# Patient Record
Sex: Male | Born: 1985 | Race: White | Hispanic: No | Marital: Married | Smoking: Current every day smoker
Health system: Southern US, Community
[De-identification: ages and names within clinical notes are randomized; demographics above are authoritative.]

## PROBLEM LIST (undated history)

## (undated) DIAGNOSIS — F1120 Opioid dependence, uncomplicated: Secondary | ICD-10-CM

## (undated) DIAGNOSIS — M549 Dorsalgia, unspecified: Secondary | ICD-10-CM

## (undated) DIAGNOSIS — I10 Essential (primary) hypertension: Secondary | ICD-10-CM

---
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
## 2003-01-15 ENCOUNTER — Encounter: Payer: Self-pay | Admitting: Family Medicine

## 2003-01-15 ENCOUNTER — Ambulatory Visit (HOSPITAL_COMMUNITY): Admission: RE | Admit: 2003-01-15 | Discharge: 2003-01-15 | Payer: Self-pay | Admitting: Family Medicine

## 2003-01-29 ENCOUNTER — Emergency Department (HOSPITAL_COMMUNITY): Admission: EM | Admit: 2003-01-29 | Discharge: 2003-01-29 | Payer: Self-pay | Admitting: *Deleted

## 2003-01-29 ENCOUNTER — Encounter: Payer: Self-pay | Admitting: *Deleted

## 2003-05-02 ENCOUNTER — Encounter: Payer: Self-pay | Admitting: Emergency Medicine

## 2003-05-02 ENCOUNTER — Emergency Department (HOSPITAL_COMMUNITY): Admission: EM | Admit: 2003-05-02 | Discharge: 2003-05-02 | Payer: Self-pay | Admitting: Emergency Medicine

## 2003-07-09 ENCOUNTER — Encounter: Payer: Self-pay | Admitting: *Deleted

## 2003-07-09 ENCOUNTER — Emergency Department (HOSPITAL_COMMUNITY): Admission: EM | Admit: 2003-07-09 | Discharge: 2003-07-09 | Payer: Self-pay | Admitting: *Deleted

## 2003-09-01 ENCOUNTER — Emergency Department (HOSPITAL_COMMUNITY): Admission: EM | Admit: 2003-09-01 | Discharge: 2003-09-01 | Payer: Self-pay | Admitting: Emergency Medicine

## 2004-01-12 ENCOUNTER — Emergency Department (HOSPITAL_COMMUNITY): Admission: EM | Admit: 2004-01-12 | Discharge: 2004-01-12 | Payer: Self-pay | Admitting: Emergency Medicine

## 2005-12-04 ENCOUNTER — Emergency Department (HOSPITAL_COMMUNITY): Admission: EM | Admit: 2005-12-04 | Discharge: 2005-12-04 | Payer: Self-pay | Admitting: Emergency Medicine

## 2005-12-14 ENCOUNTER — Emergency Department (HOSPITAL_COMMUNITY): Admission: EM | Admit: 2005-12-14 | Discharge: 2005-12-14 | Payer: Self-pay | Admitting: Emergency Medicine

## 2006-02-02 ENCOUNTER — Emergency Department (HOSPITAL_COMMUNITY): Admission: EM | Admit: 2006-02-02 | Discharge: 2006-02-02 | Payer: Self-pay | Admitting: Emergency Medicine

## 2006-02-15 ENCOUNTER — Emergency Department (HOSPITAL_COMMUNITY): Admission: EM | Admit: 2006-02-15 | Discharge: 2006-02-15 | Payer: Self-pay | Admitting: Emergency Medicine

## 2006-07-14 ENCOUNTER — Emergency Department (HOSPITAL_COMMUNITY): Admission: EM | Admit: 2006-07-14 | Discharge: 2006-07-14 | Payer: Self-pay | Admitting: Emergency Medicine

## 2008-02-17 ENCOUNTER — Emergency Department (HOSPITAL_COMMUNITY): Admission: EM | Admit: 2008-02-17 | Discharge: 2008-02-17 | Payer: Self-pay | Admitting: Emergency Medicine

## 2010-05-26 ENCOUNTER — Emergency Department (HOSPITAL_COMMUNITY): Admission: EM | Admit: 2010-05-26 | Discharge: 2010-05-26 | Payer: Self-pay | Admitting: Emergency Medicine

## 2010-09-18 ENCOUNTER — Emergency Department (HOSPITAL_COMMUNITY): Admission: EM | Admit: 2010-09-18 | Discharge: 2010-09-18 | Payer: Self-pay | Admitting: Emergency Medicine

## 2011-04-14 ENCOUNTER — Emergency Department (HOSPITAL_COMMUNITY)
Admission: EM | Admit: 2011-04-14 | Discharge: 2011-04-14 | Disposition: A | Discharge status: Home / Self Care | Payer: Medicaid Other | Attending: Emergency Medicine | Admitting: Emergency Medicine

## 2011-04-14 ENCOUNTER — Emergency Department (HOSPITAL_COMMUNITY): Payer: Medicaid Other

## 2011-04-14 DIAGNOSIS — M79609 Pain in unspecified limb: Secondary | ICD-10-CM | POA: Insufficient documentation

## 2011-04-17 ENCOUNTER — Emergency Department (HOSPITAL_COMMUNITY)
Admission: EM | Admit: 2011-04-17 | Discharge: 2011-04-17 | Disposition: A | Discharge status: Home / Self Care | Payer: Medicaid Other | Attending: Emergency Medicine | Admitting: Emergency Medicine

## 2011-04-17 ENCOUNTER — Emergency Department (HOSPITAL_COMMUNITY): Payer: Medicaid Other

## 2011-04-17 DIAGNOSIS — S9030XA Contusion of unspecified foot, initial encounter: Secondary | ICD-10-CM | POA: Insufficient documentation

## 2011-04-17 DIAGNOSIS — Y92009 Unspecified place in unspecified non-institutional (private) residence as the place of occurrence of the external cause: Secondary | ICD-10-CM | POA: Insufficient documentation

## 2011-04-17 DIAGNOSIS — Y30XXXA Falling, jumping or pushed from a high place, undetermined intent, initial encounter: Secondary | ICD-10-CM | POA: Insufficient documentation

## 2011-04-17 DIAGNOSIS — F17200 Nicotine dependence, unspecified, uncomplicated: Secondary | ICD-10-CM | POA: Insufficient documentation

## 2011-05-05 ENCOUNTER — Ambulatory Visit (HOSPITAL_COMMUNITY)
Admission: RE | Admit: 2011-05-05 | Discharge: 2011-05-05 | Disposition: A | Discharge status: Home / Self Care | Payer: Medicaid Other | Source: Ambulatory Visit | Attending: Orthopaedic Surgery | Admitting: Orthopaedic Surgery

## 2011-05-05 DIAGNOSIS — IMO0001 Reserved for inherently not codable concepts without codable children: Secondary | ICD-10-CM | POA: Insufficient documentation

## 2011-05-05 DIAGNOSIS — M6281 Muscle weakness (generalized): Secondary | ICD-10-CM | POA: Insufficient documentation

## 2011-05-05 DIAGNOSIS — M25579 Pain in unspecified ankle and joints of unspecified foot: Secondary | ICD-10-CM | POA: Insufficient documentation

## 2011-05-05 DIAGNOSIS — R262 Difficulty in walking, not elsewhere classified: Secondary | ICD-10-CM | POA: Insufficient documentation

## 2011-05-17 ENCOUNTER — Telehealth (HOSPITAL_COMMUNITY): Payer: Self-pay

## 2011-05-17 ENCOUNTER — Inpatient Hospital Stay (HOSPITAL_COMMUNITY): Admission: RE | Admit: 2011-05-17 | Payer: Medicaid Other | Source: Ambulatory Visit

## 2011-05-19 ENCOUNTER — Ambulatory Visit (HOSPITAL_COMMUNITY)
Admission: RE | Admit: 2011-05-19 | Discharge: 2011-05-19 | Disposition: A | Discharge status: Home / Self Care | Payer: Medicaid Other | Source: Ambulatory Visit

## 2011-05-19 DIAGNOSIS — M25579 Pain in unspecified ankle and joints of unspecified foot: Secondary | ICD-10-CM | POA: Insufficient documentation

## 2011-05-19 NOTE — Progress Notes (Signed)
Physical Therapy Treatment Patient Name: Daniel Gonzales [id number] Date: 05/19/2011  Time In: 5:35 Time Out: 6:24 Visit #: 2/3 Initial eval: 05/05/2011 Next Re-eval: 06/05/2011 Charge: therex 39 min Ice x 10 min  Subjective: Symptoms/Limitations Symptoms: 2-3/10 R ankle Limitations: Walking How long can you sit comfortably?: unlimitied How long can you stand comfortably?: 1 hour How long can you walk comfortably?: 15-20 minutes Pain Assessment Currently in Pain?: Yes Pain Score:   3 Pain Location: Ankle Pain Orientation: Right Pain Type: Acute pain Pain Onset: 1 to 4 weeks ago Pain Frequency: Intermittent Pain Relieving Factors: ice with elevation   Exercise/Treatments STANDING: Heel raise Toe raise Toe walking Heel walking Baps L2 5 reps SLS on foam max of 3 Gastroc st. 3x 30" Soleus st 3x 30" Plantar st 3x 30" SITTING Towel inversion/eversion Towel crunch Blue tband all directions 10x 5" hold Ice x 10 min Stability Exercises Heel Raises: 10 reps Additional Hip Exercises SLS: 1'+, 39" on foam Knee Exercises Heel Raises: 10 reps Additional Knee Exercises SLS: 1'+, 39" on foam Ankle Exercises Ankle Dorsiflexion: 10 reps;Theraband Theraband Level (Ankle Dorsiflexion): Level 4 (Blue) Ankle Plantar Flexion: 10 reps;Theraband Theraband Level (Ankle Plantar Flexion): Level 4 (Blue) Ankle Eversion: 10 reps;Theraband Theraband Level (Ankle Eversion): Level 4 (Blue) Ankle Inversion: 10 reps;Theraband Theraband Level (Ankle Inversion): Level 4 (Blue) Heel Raises: 10 reps Toe Raise: 10 reps Additional Ankle Exercises ABC's: 1 rep Towel Crunch: 1 rep Towel Inversion/Eversion: 3 reps BAPS: Level 2;5 reps SLS: 1'+, 39" on foam Heel Walk (Round Trip): 1 Toe Walk (Round Trip): 1 Balance Exercises Heel Raises: 10 reps Toe Raise: 10 reps Modalities Modalities: Cryotherapy Cryotherapy Number Minutes Cryotherapy: 10 Minutes Cryotherapy Location: Ankle Type of  Cryotherapy: Ice pack  Goals PT Short Term Goals Short Term Goal 1: Pt. will be independent in HEP in order to maximize therapeutic effect. Short Term Goal 2: Pt. will report pain less than or equal to 2/10 for 50% of his day. Short Term Goal 3: Pt will improve LE strength to WNL. Short Term Goal 4: Pt will present with decreased allodynia to the R ankle and foot. PT Long Term Goals Long Term Goal 1: Pt will report pain less than or equal to 2/10 while independently walking for 1 hour in order to participate in community activities. 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 Long Term Goal 3: Pt will improve LEFS by 9 points for improved perceived functional ability. End of Session Patient Active Problem List  Diagnoses  . Pain in joint, ankle and foot   PT - End of Session Activity Tolerance: Patient tolerated treatment well General Behavior During Session: [facility] for tasks performed Cognition: [facility] for tasks performed PT Assessment and Plan Clinical Impression Statement: Pt tolerated tx well, followed demonstration with min vc for ankle movements rathan than hip motions.  Therapist stabilized knee with sitting ex for increased ankle ROM.  Pt stated pain with eversion motion, pain decreased at end of session with ice. Rehab Potential: Good PT Frequency: Min 2X/week PT Duration: 4 weeks PT Treatment/Interventions: Therapeutic exercise;Gait training;Balance training (modalities for pain reduction) PT Plan: continue with current POC 2 more sessions per medicaid approval.  Juel Burrow 05/19/2011, 6:30 PM

## 2011-05-24 ENCOUNTER — Ambulatory Visit (HOSPITAL_COMMUNITY): Payer: Medicaid Other

## 2011-05-26 ENCOUNTER — Ambulatory Visit (HOSPITAL_COMMUNITY): Payer: Medicaid Other | Admitting: *Deleted

## 2011-05-31 ENCOUNTER — Ambulatory Visit (HOSPITAL_COMMUNITY): Payer: Medicaid Other | Admitting: Physical Therapy

## 2011-06-02 ENCOUNTER — Inpatient Hospital Stay (HOSPITAL_COMMUNITY): Admission: RE | Admit: 2011-06-02 | Payer: Medicaid Other | Source: Ambulatory Visit

## 2011-06-02 ENCOUNTER — Telehealth (HOSPITAL_COMMUNITY): Payer: Self-pay

## 2011-06-21 ENCOUNTER — Ambulatory Visit (HOSPITAL_COMMUNITY): Payer: Medicaid Other | Admitting: Physical Therapy

## 2011-06-22 ENCOUNTER — Telehealth (HOSPITAL_COMMUNITY): Payer: Self-pay

## 2011-06-28 ENCOUNTER — Inpatient Hospital Stay (HOSPITAL_COMMUNITY): Admission: RE | Admit: 2011-06-28 | Payer: Medicaid Other | Source: Ambulatory Visit | Admitting: Physical Therapy

## 2011-07-14 ENCOUNTER — Ambulatory Visit (HOSPITAL_COMMUNITY)
Admission: RE | Admit: 2011-07-14 | Discharge: 2011-07-14 | Disposition: A | Discharge status: Home / Self Care | Payer: Medicaid Other | Source: Ambulatory Visit | Attending: Orthopaedic Surgery | Admitting: Orthopaedic Surgery

## 2011-07-14 DIAGNOSIS — IMO0001 Reserved for inherently not codable concepts without codable children: Secondary | ICD-10-CM | POA: Insufficient documentation

## 2011-07-14 DIAGNOSIS — M542 Cervicalgia: Secondary | ICD-10-CM | POA: Insufficient documentation

## 2011-07-14 DIAGNOSIS — M25619 Stiffness of unspecified shoulder, not elsewhere classified: Secondary | ICD-10-CM | POA: Insufficient documentation

## 2011-07-14 NOTE — Progress Notes (Signed)
Physical Therapy Evaluation  Patient Details  Name: Daniel Gonzales MRN: [id number] Date of Birth: [birth year]  Today's Date: 07/14/2011 Time: 1630-1710 Time Calculation (min): 40 min Visit#: 1 of 4 Re-eval: 07/28/11    Past Medical History: No past medical history on file. Past Surgical History: No past surgical history on file.  Subjective Symptoms/Limitations Symptoms: Pt states that when he fell off the porch his neck got hurt as well.  He states that at this point his ankle is doing well but his neck is in constant pain. Pain Assessment Pain Score:   3 (max 8/10) Pain Location: Arm Pain Orientation: Right;Left Pain Type: Chronic pain Pain Onset: More than a month ago Pain Frequency: Constant Pain Relieving Factors: lieing down  Precautions/Restrictions     Prior Functioning  Home Living Type of Home: House Lives With: Family Prior Function Level of Independence: Independent with basic ADLs Driving: Yes Able to Take Stairs Reciprically: Yes  Cognition Cognition Overall Cognitive Status: Appears within functional limits for tasks assessed Arousal/Alertness: Awake/alert  Sensation/Coordination/Flexibility    Assessment Cervical AROM Overall Cervical AROM: Within functional limits for tasks performed Cervical Strength Overall Cervical Strength: Deficits  Mobility (including Balance)    Posture/Postural Control Posture/Postural Control:  (Pt has slight forward head and sits in a forward slumped pos)  Exercise/Treatments Stretches   Neck Exercises Neck Retraction: 10 reps Shoulder Extension: Strengthening;Both;Theraband Theraband Level (Shoulder Extension): Level 4 (Blue) Row: Strengthening;Both;Theraband Theraband Level (Row): Level 4 (Blue) Scapular Retraction: Strengthening;Both;Standing;Theraband Theraband Level (Scapular Retraction): Level 4 (Blue) Additional Neck Exercises       Physical Therapy Assessment and Plan PT Assessment and  Plan Clinical Impression Statement: Pt with neck weakness and pain who will benefit from skilled PT to educate in posture and strengthening ex. Rehab Potential: Good PT Frequency: Min 2X/week PT Duration:  (wks) PT Treatment/Interventions: Therapeutic exercise PT Plan: see for three treatments and D/C to HEP    Goals PT Short Term Goals Time to Complete Goals: 2 weeks PT Short Term Goal 1: I HEP PT Short Term Goal 2: Strength of c spine 4+/5 PT Short Term Goal 3: Pain no greater than a 4  Problem List Patient Active Problem List  Diagnoses  . Pain in joint, ankle and foot  . Cervicalgia    PT - End of Session Activity Tolerance: Patient tolerated treatment well General Behavior During Session: [facility] for tasks performed Cognition: [facility] for tasks performed   Riely,CINDY 07/14/2011, 5:26 PM  Physician Documentation Your signature is required to indicate approval of the treatment plan as stated above.  Please sign and either send electronically or make a copy of this report for your files and return this physician signed original.   Please mark one 1.__approve of plan  2. ___approve of plan with the following conditions.   ______________________________                                                          _____________________ Physician Signature  Date  

## 2011-07-14 NOTE — Patient Instructions (Signed)
HEP T-band,(given), c-retraction and shoulder shrugs.

## 2011-07-26 ENCOUNTER — Ambulatory Visit (HOSPITAL_COMMUNITY)
Admission: RE | Admit: 2011-07-26 | Discharge: 2011-07-26 | Disposition: A | Discharge status: Home / Self Care | Payer: Medicaid Other | Source: Ambulatory Visit | Attending: Orthopaedic Surgery | Admitting: Orthopaedic Surgery

## 2011-07-26 DIAGNOSIS — IMO0001 Reserved for inherently not codable concepts without codable children: Secondary | ICD-10-CM | POA: Insufficient documentation

## 2011-07-26 DIAGNOSIS — R262 Difficulty in walking, not elsewhere classified: Secondary | ICD-10-CM | POA: Insufficient documentation

## 2011-07-26 DIAGNOSIS — M542 Cervicalgia: Secondary | ICD-10-CM

## 2011-07-26 DIAGNOSIS — M6281 Muscle weakness (generalized): Secondary | ICD-10-CM | POA: Insufficient documentation

## 2011-07-26 DIAGNOSIS — M25579 Pain in unspecified ankle and joints of unspecified foot: Secondary | ICD-10-CM | POA: Insufficient documentation

## 2011-07-26 NOTE — Progress Notes (Signed)
Physical Therapy Treatment Patient Details  Name: Daniel Gonzales MRN: [id number] Date of Birth: Feb 04, 1986  Today's Date: 07/26/2011 Time: 4098-1191 Time Calculation (min): 45 min Visit#: 2  of 4   Re-eval: 07/28/11  Charge: therex 29 min Manual STM 12 min  Subjective: Symptoms/Limitations Symptoms: Pt has been painting with work, increased neck pain L>R 5/10. Pain Assessment Currently in Pain?: Yes Pain Score:   5 Pain Location: Neck Pain Orientation: Left  Objective: forward rolled shoulder posture, vc to correct  Exercise/Treatments Stretches Upper Trapezius Stretch: 3 reps;30 seconds Levator Stretch: 3 reps;30 seconds Shoulder Rolls: 10 Neck Exercises Neck Retraction: 10 reps;Seated Neck Lateral Flexion - Right: 5 reps;Seated;Limitations Neck Lateral Flexion - Right Limitations: isometrics x 5" Neck Lateral Flexion - Left: 5 reps;Seated;Limitations Neck Lateral Flexion - Left Limitations: isometric x 5" Neck Rotation - Right: 5 reps;Seated;Limitations Neck Rotation - Right Limitations: isometrics x 5" Neck Rotation - Left: 5 reps;Seated;Limitations Neck Rotation - Left Limitations: isometric x 5" X to V: Seated;10 reps Additional Neck Exercises UBE (Upper Arm Bike): 4' @ 1.0 backwards  Manual Therapy Manual Therapy: Other (comment) Other Manual Therapy: STM upper, mid and lower traps, L scalenes and levator.  Physical Therapy Assessment and Plan PT Assessment and Plan Clinical Impression Statement: Began postural strengthening therex and stretches per doc flow sheet.  Pt performed all activities correctly with difficulty but stated increased neck pain.  Able to partially resolve spasms L upper trap, scalene and levator with STM. Palpalated edema L upper and mid traps.  Pt encouraged to ice neck at home (had child screaming in waiting room, had to leave). PT Plan: Continue POC for 2 more sessions then D/C to HEP.    Goals    Problem List Patient Active  Problem List  Diagnoses  . Pain in joint, ankle and foot  . Cervicalgia    PT - End of Session Activity Tolerance: Patient tolerated treatment well General Behavior During Session: [facility] for tasks performed Cognition: [facility] for tasks performed  Juel Burrow 07/26/2011, 6:21 PM

## 2011-07-28 ENCOUNTER — Ambulatory Visit (HOSPITAL_COMMUNITY): Payer: Medicaid Other

## 2011-07-28 ENCOUNTER — Telehealth (HOSPITAL_COMMUNITY): Payer: Self-pay

## 2011-08-08 ENCOUNTER — Inpatient Hospital Stay (HOSPITAL_COMMUNITY): Admission: RE | Admit: 2011-08-08 | Payer: Medicaid Other | Source: Ambulatory Visit

## 2011-08-08 ENCOUNTER — Telehealth (HOSPITAL_COMMUNITY): Payer: Self-pay

## 2011-09-27 ENCOUNTER — Ambulatory Visit (HOSPITAL_COMMUNITY): Payer: Medicaid Other | Attending: Orthopaedic Surgery | Admitting: Physical Therapy

## 2011-09-27 DIAGNOSIS — M542 Cervicalgia: Secondary | ICD-10-CM | POA: Insufficient documentation

## 2011-09-27 DIAGNOSIS — IMO0001 Reserved for inherently not codable concepts without codable children: Secondary | ICD-10-CM | POA: Insufficient documentation

## 2011-09-27 DIAGNOSIS — M25619 Stiffness of unspecified shoulder, not elsewhere classified: Secondary | ICD-10-CM | POA: Insufficient documentation

## 2011-11-09 ENCOUNTER — Ambulatory Visit (HOSPITAL_COMMUNITY): Payer: Medicaid Other

## 2011-11-13 ENCOUNTER — Ambulatory Visit (HOSPITAL_COMMUNITY)
Admission: RE | Admit: 2011-11-13 | Discharge: 2011-11-13 | Disposition: A | Discharge status: Home / Self Care | Payer: Medicaid Other | Source: Ambulatory Visit | Attending: Orthopaedic Surgery | Admitting: Orthopaedic Surgery

## 2011-11-13 DIAGNOSIS — M542 Cervicalgia: Secondary | ICD-10-CM | POA: Insufficient documentation

## 2011-11-13 DIAGNOSIS — M6281 Muscle weakness (generalized): Secondary | ICD-10-CM | POA: Insufficient documentation

## 2011-11-13 DIAGNOSIS — M545 Low back pain, unspecified: Secondary | ICD-10-CM | POA: Insufficient documentation

## 2011-11-13 DIAGNOSIS — IMO0001 Reserved for inherently not codable concepts without codable children: Secondary | ICD-10-CM | POA: Insufficient documentation

## 2011-11-13 DIAGNOSIS — M25579 Pain in unspecified ankle and joints of unspecified foot: Secondary | ICD-10-CM | POA: Insufficient documentation

## 2011-11-13 NOTE — Progress Notes (Signed)
Physical Therapy Evaluation  Patient Details  Name: Daniel Gonzales MRN: [id number] Date of Birth: 21-Oct-1986  Today's Date: 11/13/2011 Time: 4098-1191 Time Calculation (min): 35 min Medicaid Authorization Time Period:  through Feb 1st   Medicaid Visit#: 0  of 3    Visit#: 1  of 4   Re-eval:   Assessment Diagnosis: cervical/ back pain.  Past Medical History: No past medical history on file. Past Surgical History: No past surgical history on file.  Subjective Symptoms/Limitations Symptoms: Daniel Gonzales states that he fell off a porch injuring both his low back and his neck area.  This incident occured June 15th of 2012.  The patient has been in therapy for both his back and his neck before  he is now doing his exercises twice a week.  The patient states that his neck is bgothering him more than his back.  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  He denies radiating pain down his legs and states the pain in his neck goes into his mid back but not into his arms.  He is being referred to therapy . How long can you sit comfortably?: sitting increases his pain in between his shoulder blades.  How long can you stand comfortably?: The patient states that he is able to stand for five to ten minutes before he has increased pain. How long can you walk comfortably?: walking is no problem Pain Assessment Currently in Pain?: Yes Pain Score:   4 (worst 8; best 3) Pain Location: Neck Pain Orientation: Right;Left Pain Type: Chronic pain Pain Radiating Towards: shoulder blade area. Pain Onset: More than a month ago Pain Frequency: Constant (varies in intensity.l) Multiple Pain Sites: Yes   Home Living Lives With: Family Type of Home: House Prior Function Level of Independence: Independent with basic ADLs Able to Take Stairs?: Yes Driving: Yes  Cognition/Observation Cognition Overall Cognitive Status: Appears within functional limits for tasks assessed Arousal/Alertness:  Awake/alert Observation/Other Assessments Observations: Pt continues to have slumped sitting posture despite verbally counseling pt that this will increase neck pain.  Sensation/Coordination/Flexibility/Functional Tests    Assessment Cervical AROM Overall Cervical AROM: Deficits (B rotation and R SB decreased 20%; ) Cervical Strength Overall Cervical Strength: Deficits (side bend decreased 20% B) Palpation Palpation: moderate mm spasm B trap  Exercise/Treatments Mobility/Balance  Posture/Postural Control Posture/Postural Control:  (Pt has slight forward head and sits in a forward slumped pos)     Physical Therapy Assessment and Plan PT Assessment and Plan Clinical Impression Statement: Pt already has exercises will concentrate next three sessions on modalities to decrease pain. Rehab Potential: Good PT Frequency: Min 2X/week PT Duration:  (2 wk) PT Plan: see three visits for STM and [place] with e-stim    Goals Home Exercise Program Pt will Perform Home Exercise Program: Independently PT Goal: Perform Home Exercise Program - Progress: Met PT Short Term Goals Time to Complete Short Term Goals: 2 weeks PT Short Term Goal 1: decrease sx of pain by 3 levels PT Short Term Goal 2: min or no mm spasm in trap area  Problem List Patient Active Problem List  Diagnoses  . Pain in joint, ankle and foot  . Cervicalgia    PT - End of Session Activity Tolerance: Patient tolerated treatment well General Behavior During Session: [facility] for tasks performed Cognition: [facility] for tasks performed   Daniel Gonzales 11/13/2011, 5:07 PM  Physician Documentation Your signature is required to indicate approval of the treatment plan as stated above.  Please sign and either send electronically or make a copy of this report for your files and return this physician signed original.   Please mark one 1.__approve of plan  2. ___approve of plan with the following  conditions.   ______________________________                                                          _____________________ Physician Signature                                                                                                             Date

## 2011-11-21 ENCOUNTER — Ambulatory Visit (HOSPITAL_COMMUNITY): Payer: Medicaid Other | Admitting: Physical Therapy

## 2011-11-23 ENCOUNTER — Ambulatory Visit (HOSPITAL_COMMUNITY)
Admission: RE | Admit: 2011-11-23 | Discharge: 2011-11-23 | Disposition: A | Discharge status: Home / Self Care | Payer: Medicaid Other | Source: Ambulatory Visit | Attending: Orthopaedic Surgery | Admitting: Orthopaedic Surgery

## 2011-11-23 NOTE — Progress Notes (Signed)
Physical Therapy Treatment Patient Details  Name: Daniel Gonzales MRN: [id number] Date of Birth: 1986/07/03  Today's Date: 11/23/2011 Time: 8295-6213 Time Calculation (min): 45 min Visit#: 2  of 4   Charges: Therex x 10' [place] x 8' Manual x 15'  Subjective: Symptoms/Limitations Symptoms: Pt reports that his neck was really bothering him at work today but the pain has gone down some. Pt states at work his pain was a 6/10. Pain Assessment Pain Score:   4 Pain Location: Neck Pain Orientation: Right;Left Pain Type: Chronic pain   Exercise/Treatments Stretches Levator Stretch: 2 reps;30 seconds Neck Exercises Neck Retraction: 10 reps  Modalities Modalities: Ultrasound;Electrical Stimulation Manual Therapy Other Manual Therapy: STM upper, mid and lower traps and rhomboids x 15' (Bilateral) Pharmacologist Location: B rhombiods/upper-mid trap Statistician Action: combo with [place] Electrical Stimulation Parameters: L 8; 4' each side Ultrasound Ultrasound Location: B rhombiods/upper-mid trap Ultrasound Parameters: Combo with estim 1.0 w/cm2 @ 4' each side Ultrasound Goals: Pain  Physical Therapy Assessment and Plan PT Assessment and Plan Clinical Impression Statement: Began estim/Us combo to decrease spams followed by massage. Multiple spasms noted in B traps and rhomboids. Pt reports pain decrease to 2/10 at end of session. PT Plan: Continue to progress per PT POC.    Goals    Problem List Patient Active Problem List  Diagnoses  . Pain in joint, ankle and foot  . Cervicalgia    PT - End of Session Activity Tolerance: Patient tolerated treatment well General Behavior During Session: [facility] for tasks performed Cognition: [facility] for tasks performed  Antonieta Iba 11/23/2011, 5:52 PM

## 2011-11-28 ENCOUNTER — Ambulatory Visit (HOSPITAL_COMMUNITY): Payer: Medicaid Other | Admitting: *Deleted

## 2011-11-30 ENCOUNTER — Ambulatory Visit (HOSPITAL_COMMUNITY): Payer: Medicaid Other | Admitting: Physical Therapy

## 2011-12-04 ENCOUNTER — Ambulatory Visit (HOSPITAL_COMMUNITY)
Admission: RE | Admit: 2011-12-04 | Discharge: 2011-12-04 | Disposition: A | Discharge status: Home / Self Care | Payer: Medicaid Other | Source: Ambulatory Visit | Attending: Orthopaedic Surgery | Admitting: Orthopaedic Surgery

## 2011-12-04 DIAGNOSIS — IMO0001 Reserved for inherently not codable concepts without codable children: Secondary | ICD-10-CM | POA: Insufficient documentation

## 2011-12-04 DIAGNOSIS — M542 Cervicalgia: Secondary | ICD-10-CM | POA: Insufficient documentation

## 2011-12-04 DIAGNOSIS — M25619 Stiffness of unspecified shoulder, not elsewhere classified: Secondary | ICD-10-CM | POA: Insufficient documentation

## 2011-12-04 NOTE — Progress Notes (Signed)
Physical Therapy Re-Evaluation-Medicaid  Patient Details  Name: Daniel Gonzales MRN: [id number] Date of Birth: [birth year]  Today's Date: 12/04/2011 Time: 6578-4696 Time Calculation (min): 70 min Charges: 1 re-eval, 32 TE, 8 Combo, 15 STM Visit#: 3  of 4   Re-eval: 12/04/11   Subjective Symptoms/Limitations Symptoms: Neck pain, fell through the floor Tues last week jarred neck and back.   Pain Assessment Currently in Pain?: Yes Pain Score:   3 Pain Location: Knee Pain Orientation: Left (in between the shoulder blades.  ) Pain Type: Chronic pain  Cognition/Observation Observation/Other Assessments Observations: the patient has an elevated shoulder on his left side with slight forward head and slight rounded shoulders, he generally has a flattened spine.    Assessment Cervical AROM Cervical - Right Side Bend: decreased 10% (painful pulling into the left UT and levator) Cervical - Left Side Bend: decreased 10% (just pulling into right UT) Cervical Strength Cervical Flexion: 4/5 Cervical Extension: 4/5 Cervical - Right Side Bend: 4/5 Cervical - Left Side Bend: 4/5 Cervical - Right Rotation: 4/5 Cervical - Left Rotation: 4/5 Palpation Palpation: mild spasms in UT left side near C4, moderate spasm in left levator scapulae muscle, and one muscle spasm in rhomboid left side at the inferior border of the L scapula.    Exercise/Treatments Mobility/Balance  Posture/Postural Control Posture/Postural Control:  (the patient is elevated in sitting and standing on his left)   Stretches Upper Trapezius Stretch: 30 seconds;2 reps Levator Stretch: 2 reps;30 seconds Shoulder Rolls: 12 posterior Neck Exercises Neck Retraction: Limitations Neck Retraction Limitations: 12 reps Neck Lateral Flexion - Right: 5 reps;Seated;Limitations Neck Lateral Flexion - Right Limitations: isometrics x 5" Neck Lateral Flexion - Left: 5 reps;Seated;Limitations Neck Lateral Flexion - Left Limitations:  isometric x 5" Neck Rotation - Right: 5 reps;Seated;Limitations Neck Rotation - Right Limitations: isometrics x 5" Neck Rotation - Left: 5 reps;Seated;Limitations Neck Rotation - Left Limitations: isometrics x 5" Shoulder Extension: Strengthening;Both;Theraband Theraband Level (Shoulder Extension): Level 4 (Blue) Row: Strengthening;Both;Theraband Theraband Level (Row): Level 4 (Blue) Scapular Retraction: Strengthening;Both;Standing;Theraband Theraband Level (Scapular Retraction): Level 4 (Blue) Additional Neck Exercises UBE (Upper Arm Bike): 4' @ 1.0 backwards  Modalities Modalities: Ultrasound Manual Therapy Manual Therapy: Massage Massage: STM upper, mid and lower traps and rhomboids x 10' focusing on left more painful side in s/l  Electrical Stimulation Electrical Stimulation Location: left cervical paraspinals, upper trap, and left rhomboid.    Electrical Stimulation Action: combo with [place]  Electrical Stimulation Parameters: 4' upper trap and cervical paraspinals, 4' lower scapula/rhomboids.   Ultrasound Ultrasound Location: left cervical paraspinals and upper trap, left lower scapula (rhomboids Ultrasound Parameters: Combo with estim 1.0 w/cm2 @ 4' each site  Physical Therapy Assessment and Plan Clinical Impression Statement: The patient had more pain on his left side today. Very tender at the left levator origin and attachment points.  re-eval done for Medicaid re-auth.   Rehab Potential: Good PT Frequency: Min 2X/week PT Duration: 8 weeks PT Treatment/Interventions: Functional mobility training;Therapeutic activities;Therapeutic exercise;Balance training;Neuromuscular re-education;Patient/family education (STM and modalities as needed for pain and spams) PT Plan: continue 2xs/wk x 8 more weeks to work on flexibility, strength, decreasing his spasm especially in left upper trap and levator muscles and education on relaxation and stretching during work.      Goals Home  Exercise Program Pt will Perform Home Exercise Program: Independently PT Goal: Perform Home Exercise Program - Progress: Met PT Short Term Goals Time to Complete Short Term Goals: 4 weeks PT Short Term  Goal 1: decrease sx of pain by 3 levels  PT Short Term Goal 1 - Progress: Progressing toward goal PT Short Term Goal 2: min or no mm spasm in trap area  PT Short Term Goal 2 - Progress: Progressing toward goal PT Short Term Goal 3: Pain no greater than a 4  PT Short Term Goal 3 - Progress: Progressing toward goal PT Short Term Goal 4: The patient will report that he can make it through 50% of his work day without pain in his neck to show improved tolerance of work activities and improved QOL.   PT Short Term Goal 4 - Progress: Other (comment) (goal set today) PT Short Term Goal 5: The patient will report less than 2 tension headaches per week to show improved muscle tension and increased QOL, decreased reliance on rest and pain meds.   PT Short Term Goal 5 - Progress: Other (comment) (goal set 12/04/11) PT Long Term Goals Time to Complete Long Term Goals: 8 weeks PT Long Term Goal 1: The patient will report an average daily pain of less than or equal to 3/10 PT Long Term Goal 1 - Progress: Other (comment) (goal set 12/04/11) PT Long Term Goal 2: The patient will increase cervical strength to [facility] to show improved stability and increased tolerance of work activities that require overhead motions and neck extension (especially).   PT Long Term Goal 2 - Progress: Other (comment) (goal set today 12/04/11) Long Term Goal 3: The patient will report a 75% decrease in his neck, upper and mid back tension improving his QOL and tolerance of work activities.   Long Term Goal 3 Progress: Other (comment) (goal set today 12/04/11) Long Term Goal 4: The pateint will report less than 2 tension headaches per week to show improved muscle tension and QOL.   Long Term Goal 4 Progress: Other (comment) (goal set today  12/04/11)  Problem List Patient Active Problem List  Diagnoses  . Pain in joint, ankle and foot  . Cervicalgia   PT - End of Session Activity Tolerance: Patient tolerated treatment well General Behavior During Session: [facility] for tasks performed Cognition: [facility] for tasks performed PT Plan of Care PT Home Exercise Plan: no new Consulted and Agree with Plan of Care: Patient  Rollene Rotunda. Larie Mathes, PT, DPT  12/04/2011, 7:40 PM

## 2011-12-08 ENCOUNTER — Ambulatory Visit (HOSPITAL_COMMUNITY): Payer: Medicaid Other | Admitting: Physical Therapy

## 2013-06-01 ENCOUNTER — Encounter (HOSPITAL_COMMUNITY): Payer: Self-pay | Admitting: *Deleted

## 2013-06-01 DIAGNOSIS — R319 Hematuria, unspecified: Secondary | ICD-10-CM | POA: Insufficient documentation

## 2013-06-01 DIAGNOSIS — R109 Unspecified abdominal pain: Secondary | ICD-10-CM | POA: Insufficient documentation

## 2013-06-01 NOTE — ED Notes (Signed)
Pt with left flank pain, left testicle pain and blood in urine, states burn with urination, denies hx of kidney stone

## 2013-06-02 ENCOUNTER — Encounter (HOSPITAL_COMMUNITY): Payer: Self-pay

## 2013-06-02 ENCOUNTER — Emergency Department (HOSPITAL_COMMUNITY): Payer: Medicaid Other

## 2013-06-02 ENCOUNTER — Emergency Department (HOSPITAL_COMMUNITY)
Admission: EM | Admit: 2013-06-02 | Discharge: 2013-06-02 | Disposition: A | Discharge status: Home / Self Care | Payer: Medicaid Other | Attending: Emergency Medicine | Admitting: Emergency Medicine

## 2013-06-02 ENCOUNTER — Emergency Department (HOSPITAL_COMMUNITY)
Admission: EM | Admit: 2013-06-02 | Discharge: 2013-06-02 | Discharge status: Against Medical Advice | Payer: Medicaid Other | Attending: Emergency Medicine | Admitting: Emergency Medicine

## 2013-06-02 DIAGNOSIS — F17200 Nicotine dependence, unspecified, uncomplicated: Secondary | ICD-10-CM | POA: Insufficient documentation

## 2013-06-02 DIAGNOSIS — R300 Dysuria: Secondary | ICD-10-CM | POA: Insufficient documentation

## 2013-06-02 DIAGNOSIS — R319 Hematuria, unspecified: Secondary | ICD-10-CM | POA: Insufficient documentation

## 2013-06-02 DIAGNOSIS — N390 Urinary tract infection, site not specified: Secondary | ICD-10-CM

## 2013-06-02 HISTORY — DX: Dorsalgia, unspecified: M54.9

## 2013-06-02 LAB — CBC WITH DIFFERENTIAL/PLATELET
Eosinophils Relative: 2 % (ref 0–5)
Lymphocytes Relative: 26 % (ref 12–46)
Lymphs Abs: 2.8 10*3/uL (ref 0.7–4.0)
MCH: 30.6 pg (ref 26.0–34.0)
MCV: 90 fL (ref 78.0–100.0)
Monocytes Absolute: 1 10*3/uL (ref 0.1–1.0)
Neutro Abs: 6.8 10*3/uL (ref 1.7–7.7)
Neutrophils Relative %: 63 % (ref 43–77)
Platelets: 218 10*3/uL (ref 150–400)
RBC: 5.1 MIL/uL (ref 4.22–5.81)
RDW: 13.5 % (ref 11.5–15.5)
WBC: 10.9 10*3/uL — ABNORMAL HIGH (ref 4.0–10.5)

## 2013-06-02 LAB — BASIC METABOLIC PANEL
Calcium: 9.2 mg/dL (ref 8.4–10.5)
Creatinine, Ser: 0.91 mg/dL (ref 0.50–1.35)
GFR calc Af Amer: >90 mL/min (ref >90)
Glucose, Bld: 91 mg/dL (ref 70–99)

## 2013-06-02 LAB — URINALYSIS, ROUTINE W REFLEX MICROSCOPIC
Ketones, ur: NEGATIVE mg/dL
Leukocytes, UA: NEGATIVE
Specific Gravity, Urine: >1.03 — ABNORMAL HIGH (ref 1.005–1.030)
Urobilinogen, UA: 0.2 mg/dL (ref 0.0–1.0)
pH: 7 (ref 5.0–8.0)

## 2013-06-02 LAB — URINE MICROSCOPIC-ADD ON

## 2013-06-02 MED ORDER — KETOROLAC TROMETHAMINE 30 MG/ML IJ SOLN
15.0000 mg | Freq: Once | INTRAMUSCULAR | Status: AC
  Administered 2013-06-02: 15 mg via INTRAVENOUS
  Filled 2013-06-02: qty 1

## 2013-06-02 MED ORDER — CEPHALEXIN 500 MG PO CAPS: 500.0000 mg | ORAL_CAPSULE | Freq: Four times a day (QID) | ORAL | Status: DC

## 2013-06-02 MED ORDER — HYDROMORPHONE HCL PF 1 MG/ML IJ SOLN
1.0000 mg | Freq: Once | INTRAMUSCULAR | Status: AC
  Administered 2013-06-02: 1 mg via INTRAVENOUS
  Filled 2013-06-02: qty 1

## 2013-06-02 MED ORDER — ONDANSETRON HCL 4 MG/2ML IJ SOLN
4.0000 mg | Freq: Once | INTRAMUSCULAR | Status: AC
  Administered 2013-06-02: 4 mg via INTRAVENOUS
  Filled 2013-06-02: qty 2

## 2013-06-02 MED ORDER — DEXTROSE 5 % IV SOLN
1.0000 g | Freq: Once | INTRAVENOUS | Status: AC
  Administered 2013-06-02: 1 g via INTRAVENOUS
  Filled 2013-06-02: qty 10

## 2013-06-02 NOTE — ED Provider Notes (Signed)
  CSN: [id number]     Arrival date & time 06/02/13  1727 History     First MD Initiated Contact with Patient 06/02/13 1758     Chief Complaint  Patient presents with  . Flank Pain  . Hematuria   Patient is a 27 y.o. male presenting with flank pain and hematuria. The history is provided by the patient.  Flank Pain This is a new problem. The current episode started more than 2 days ago. The problem occurs constantly. The problem has been gradually worsening. Associated symptoms include abdominal pain. Pertinent negatives include no chest pain, no headaches and no shortness of breath. Nothing aggravates the symptoms. Nothing relieves the symptoms. He has tried rest for the symptoms.  Hematuria Associated symptoms include abdominal pain. Pertinent negatives include no chest pain, no headaches and no shortness of breath.    Past Medical History  Diagnosis Date  . Back pain    History reviewed. No pertinent past surgical history. No family history on file. History  Substance Use Topics  . Smoking status: Current Every Day Smoker    Types: Cigarettes  . Smokeless tobacco: Not on file  . Alcohol Use: No    Review of Systems  Respiratory: Negative for shortness of breath.   Cardiovascular: Negative for chest pain.  Gastrointestinal: Positive for abdominal pain. Negative for vomiting.  Genitourinary: Positive for dysuria, hematuria and flank pain.  Neurological: Negative for headaches.  All other systems reviewed and are negative.    Allergies  Review of patient's allergies indicates no known allergies.  Home Medications  No current outpatient prescriptions on file. BP 125/84  Pulse 90  Temp(Src) 98.4 F (36.9 C) (Oral)  Resp 20  Ht 5\' 11"  (1.803 m)  Wt 175 lb (79.379 kg)  BMI 24.42 kg/m2  SpO2 99% Physical Exam CONSTITUTIONAL: Well developed/well nourished HEAD: Normocephalic/atraumatic EYES: EOMI/PERRL ENMT: Mucous membranes moist NECK: supple no meningeal  signs SPINE:entire spine nontender CV: S1/S2 noted, no murmurs/rubs/gallops noted LUNGS: Lungs are clear to auscultation bilaterally, no apparent distress ABDOMEN: soft, nontender, no rebound or guarding WU:JWJX cva tenderness No inguinal hernia/scrotal tenderness/edema noted NEURO: Pt is awake/alert, moves all extremitiesx4 EXTREMITIES: pulses normal, full ROM SKIN: warm, color normal PSYCH: no abnormalities of mood noted  ED Course   Procedures   Labs Reviewed  URINALYSIS, ROUTINE W REFLEX MICROSCOPIC - Abnormal; Notable for the following:    APPearance HAZY (*)    Specific Gravity, Urine >1.030 (*)    Hgb urine dipstick LARGE (*)    Protein, ur 100 (*)    Nitrite POSITIVE (*)    All other components within normal limits  URINE MICROSCOPIC-ADD ON - Abnormal; Notable for the following:    Bacteria, UA MANY (*)    All other components within normal limits  URINE CULTURE  BASIC METABOLIC PANEL  CBC WITH DIFFERENTIAL     MDM  Nursing notes including past medical history and social history reviewed and considered in documentation Labs/vital reviewed and considered  7:00 PM Pt with flank pain, concerning for ureteral colic but also has evidence of uti CT imaging ordered and will start antibiotics He denies h/o known kidney stones 8:26 PM CT imaging negative for ureteral stone but he has uti Referred to urology pt well appearing and stable for d/c  Joya Gaskins, MD 06/02/13 2027

## 2013-06-02 NOTE — ED Notes (Signed)
Pt c/o left flank pain that started the previous Saturday night with hematuria.

## 2013-06-02 NOTE — ED Notes (Signed)
No answer for room placement.

## 2013-06-02 NOTE — ED Notes (Signed)
No answer x 3 for room placement

## 2013-06-02 NOTE — ED Notes (Signed)
No answer x 2 for room placement

## 2013-06-04 LAB — URINE CULTURE

## 2013-06-05 ENCOUNTER — Telehealth (HOSPITAL_COMMUNITY): Payer: Self-pay | Admitting: Emergency Medicine

## 2013-06-05 NOTE — ED Notes (Signed)
Post ED Visit - Positive Culture Follow-up ° °Culture report reviewed by antimicrobial stewardship pharmacist: °[x] Wes Dulaney, Pharm.D., BCPS °[] Jeremy Frens, Pharm.D., BCPS °[] Elizabeth Martin, Pharm.D., BCPS °[] Minh Pham, Pharm.D., BCPS, AAHIVP °[] Michelle Turner, Pharm.D., BCPS, AAHIVP ° °Positive urine culture °Treated with Keflex, organism sensitive to the same and no further patient follow-up is required at this time. ° °Daniel Gonzales °06/05/2013, 2:52 PM ° ° °

## 2014-10-27 ENCOUNTER — Other Ambulatory Visit (HOSPITAL_COMMUNITY): Payer: Self-pay | Admitting: Orthopaedic Surgery

## 2014-10-27 DIAGNOSIS — M25562 Pain in left knee: Secondary | ICD-10-CM

## 2014-11-09 ENCOUNTER — Other Ambulatory Visit (HOSPITAL_COMMUNITY): Payer: Medicaid Other

## 2014-11-09 ENCOUNTER — Ambulatory Visit (HOSPITAL_COMMUNITY): Admission: RE | Admit: 2014-11-09 | Payer: Medicaid Other | Source: Ambulatory Visit

## 2014-11-16 ENCOUNTER — Ambulatory Visit (HOSPITAL_COMMUNITY): Payer: Medicaid Other | Attending: Orthopaedic Surgery

## 2015-12-07 ENCOUNTER — Ambulatory Visit (INDEPENDENT_AMBULATORY_CARE_PROVIDER_SITE_OTHER): Payer: Medicaid Other | Admitting: Orthopaedic Surgery

## 2015-12-07 VITALS — BP 105/70 | HR 62 | Resp 12 | Ht 71.0 in

## 2015-12-07 DIAGNOSIS — Z720 Tobacco use: Secondary | ICD-10-CM | POA: Diagnosis not present

## 2015-12-07 DIAGNOSIS — M542 Cervicalgia: Secondary | ICD-10-CM | POA: Diagnosis not present

## 2015-12-07 DIAGNOSIS — F17200 Nicotine dependence, unspecified, uncomplicated: Secondary | ICD-10-CM

## 2015-12-07 MED ORDER — HYDROCODONE-ACETAMINOPHEN 7.5-325 MG PO TABS: 1.0000 | ORAL_TABLET | ORAL | Status: DC | PRN

## 2015-12-07 MED ORDER — CYCLOBENZAPRINE HCL 10 MG PO TABS
10.0000 mg | ORAL_TABLET | Freq: Three times a day (TID) | ORAL | Status: DC | PRN
Start: 2015-12-07 — End: 2019-10-06

## 2015-12-08 ENCOUNTER — Encounter: Payer: Self-pay | Admitting: Orthopaedic Surgery

## 2015-12-08 DIAGNOSIS — M542 Cervicalgia: Secondary | ICD-10-CM | POA: Insufficient documentation

## 2015-12-08 DIAGNOSIS — F17200 Nicotine dependence, unspecified, uncomplicated: Secondary | ICD-10-CM | POA: Insufficient documentation

## 2015-12-08 NOTE — Progress Notes (Signed)
Patient ZO:XWRUEAV Daniel Gonzales, male DOB:September 07, 1986, 30 y.o. WUJ:[id number]  Chief Complaint  Patient presents with  . Follow-up    follow up neck pain "still hurts"    HPI  Daniel Gonzales is a 30 y.o. male who has chronic neck pain more on the right side of the neck and upper trapezius.  He has no paresthesias.  He has no new trauma.  He is doing his exercises daily.  Pain comes and goes.  He sees no pattern for the pain.   Back Pain The current episode started more than 1 year ago. The problem occurs daily. The problem has been waxing and waning since onset. The quality of the pain is described as aching. The pain does not radiate. The pain is at a severity of 3/10. The pain is mild. The pain is worse during the day. The symptoms are aggravated by twisting. He has tried analgesics, home exercises, heat, ice and NSAIDs for the symptoms. The treatment provided mild relief.    Review of Systems Review of Systems  Respiratory:       Current smoker. Does not desire to stop.  Musculoskeletal: Positive for back pain.  All other systems reviewed and are negative.   Past Medical History  Diagnosis Date  . Back pain     No past surgical history on file.  No family history on file.  Social History Social History  Substance Use Topics  . Smoking status: Current Every Day Smoker    Types: Cigarettes  . Smokeless tobacco: Not on file  . Alcohol Use: No    No Known Allergies  Current Outpatient Prescriptions  Medication Sig Dispense Refill  . cephALEXin (KEFLEX) 500 MG capsule Take 1 capsule (500 mg total) by mouth 4 (four) times daily. 40 capsule 0  . cyclobenzaprine (FLEXERIL) 10 MG tablet Take 1 tablet (10 mg total) by mouth 3 (three) times daily as needed for muscle spasms. 90 tablet 3  . HYDROcodone-acetaminophen (NORCO) 7.5-325 MG tablet Take 1 tablet by mouth every 4 (four) hours as needed for moderate pain (Must last 30 days.  Do not drive or operate machinery while taking  this medicine.). 120 tablet 0  . tizanidine (ZANAFLEX) 2 MG capsule Take 2 mg by mouth 2 (two) times daily as needed for muscle spasms.     No current facility-administered medications for this visit.     Physical Exam  Blood pressure 105/70, pulse 62, height  (1.803 m).  Constitutional: overall normal hygiene, normal nutrition, well developed, normal grooming, normal body habitus. Assistive device:none  Musculoskeletal: gait and station Limp none, muscle tone and strength are normal, no tremors or atrophy is present.  .  Neurological: coordination overall normal.  Deep tendon reflex/nerve stretch intact.  Sensation normal.  Cranial nerves II-XII intact.   Skin:normal overall no scars, lesions, ulcers or rash es. No psoriasis.  Psychiatric: Alert and oriented x 3.  Recent memory intact, remote memory unclear.  Normal mood and affect. Well groomed.  Good eye contact.  Cardiovascular: overall no swelling, no varicosities, no edema bilaterally, normal temperatures of the legs and arms, no clubbing, cyanosis and good capillary refill.  Lymphatic: palpation is normal.  His neck is tender more on the right posterior side with some right upper trapezius tenderness. ROM of the neck is full and strength and tone are normal.  Reflexes are normal.  Grips are normal.  ROM of the shoulders is normal bilaterally with no crepitus.  PLAN Call if any  problems.  Precautions discussed.  Continue current medications. I have asked him to stop or decrease smoking and he declines.  He will continue his current medicines.  Return to clinic 3 months.

## 2015-12-08 NOTE — Patient Instructions (Signed)
Continue current exercises.  Continue present medicines.  Try to stop smoking.

## 2016-01-04 ENCOUNTER — Other Ambulatory Visit: Payer: Self-pay | Admitting: Orthopaedic Surgery

## 2016-01-04 ENCOUNTER — Telehealth: Payer: Self-pay | Admitting: Orthopaedic Surgery

## 2016-01-04 MED ORDER — HYDROCODONE-ACETAMINOPHEN 7.5-325 MG PO TABS: 1.0000 | ORAL_TABLET | ORAL | Status: DC | PRN

## 2016-01-04 NOTE — Telephone Encounter (Signed)
Patient requesting refill of Norco 7.5 with pick up of 7/8 in the afternoon.

## 2016-01-04 NOTE — Telephone Encounter (Signed)
Rx done. 

## 2016-02-02 ENCOUNTER — Other Ambulatory Visit: Payer: Self-pay | Admitting: Orthopaedic Surgery

## 2016-02-02 ENCOUNTER — Telehealth: Payer: Self-pay | Admitting: Orthopaedic Surgery

## 2016-02-02 MED ORDER — HYDROCODONE-ACETAMINOPHEN 5-325 MG PO TABS: 1.0000 | ORAL_TABLET | ORAL | Status: DC | PRN

## 2016-02-02 NOTE — Telephone Encounter (Signed)
Rx done. 

## 2016-02-02 NOTE — Addendum Note (Signed)
Addended by: Earnstine RegalKEELING, JOHN W on: 02/02/2016 02:08 PM   Modules accepted: Orders, Medications

## 2016-02-02 NOTE — Telephone Encounter (Signed)
Patient requesting refill of Hydrocodone 7.5/325mg Qty 120 Tablets °

## 2016-03-01 ENCOUNTER — Other Ambulatory Visit: Payer: Self-pay | Admitting: Orthopaedic Surgery

## 2016-03-02 MED ORDER — ACETAMINOPHEN-CODEINE #3 300-30 MG PO TABS: ORAL_TABLET | ORAL | Status: DC

## 2016-03-02 NOTE — Telephone Encounter (Signed)
Rx done. 

## 2016-03-02 NOTE — Addendum Note (Signed)
Addended by: Earnstine RegalKEELING, JOHN W on: 03/02/2016 10:37 AM   Modules accepted: Orders, Medications

## 2016-03-07 ENCOUNTER — Ambulatory Visit: Payer: Medicaid Other | Admitting: Orthopaedic Surgery

## 2016-03-08 ENCOUNTER — Ambulatory Visit: Payer: Medicaid Other | Admitting: Orthopaedic Surgery

## 2017-04-12 ENCOUNTER — Encounter: Payer: Self-pay | Admitting: Orthopaedic Surgery

## 2019-10-06 ENCOUNTER — Ambulatory Visit
Admission: EM | Admit: 2019-10-06 | Discharge: 2019-10-06 | Disposition: A | Discharge status: Home / Self Care | Payer: Medicaid Other | Attending: Emergency Medicine | Admitting: Emergency Medicine

## 2019-10-06 ENCOUNTER — Other Ambulatory Visit: Payer: Self-pay

## 2019-10-06 DIAGNOSIS — Z20822 Contact with and (suspected) exposure to covid-19: Secondary | ICD-10-CM

## 2019-10-06 DIAGNOSIS — Z20828 Contact with and (suspected) exposure to other viral communicable diseases: Secondary | ICD-10-CM | POA: Diagnosis not present

## 2019-10-06 DIAGNOSIS — R112 Nausea with vomiting, unspecified: Secondary | ICD-10-CM

## 2019-10-06 LAB — POC SARS CORONAVIRUS 2 AG -  ED: SARS Coronavirus 2 Ag: NEGATIVE

## 2019-10-06 MED ORDER — FLUTICASONE PROPIONATE 50 MCG/ACT NA SUSP: 2.0000 | Freq: Every day | NASAL | 0 refills | Status: DC

## 2019-10-06 MED ORDER — BENZONATATE 100 MG PO CAPS: 100.0000 mg | ORAL_CAPSULE | Freq: Three times a day (TID) | ORAL | 0 refills | Status: DC

## 2019-10-06 MED ORDER — CETIRIZINE HCL 10 MG PO CHEW: 10.0000 mg | CHEWABLE_TABLET | Freq: Every day | ORAL | 0 refills | Status: DC

## 2019-10-06 NOTE — Discharge Instructions (Addendum)
Rapid COVID negative.  Culture sent.  Patient should remain in quarantine until they have received culture results.  If negative you may resume normal activities (go back to work/school) while practicing hand hygiene, social distance, and mask wearing.  If positive, patient should remain in quarantine for 10 days from symptom onset AND greater than 72 hours after symptoms resolution (absence of fever without the use of fever-reducing medication and improvement in respiratory symptoms), whichever is longer Get plenty of rest and push fluids Use OTC zyrtec for nasal congestion, runny nose, and/or sore throat Use OTC flonase for nasal congestion and runny nose Tessalon for cough Use medications daily for symptom relief Use OTC medications like ibuprofen or tylenol as needed fever or pain Follow up with PCP in 1-2 days Call or go to the ED if you have any new or worsening symptoms such as fever, worsening cough, shortness of breath, chest tightness, chest pain, turning blue, changes in mental status, etc..Marland Kitchen

## 2019-10-06 NOTE — ED Provider Notes (Signed)
[facility]   [id number] 10/06/19 Arrival Time: 4967   CC: COVID symptoms  SUBJECTIVE: History from: patient.  Daniel Gonzales is a 33 y.o. male who presents HA, runny nose, congestion, productive cough with green/ clear sputum, 5 episodes of vomiting (related to cough), and 2 episodes of diarrhea that began 4 days ago.  Denies sick exposure to COVID, flu or strep.  Denies recent travel.  Has NOT tried OTC medications.  Symptoms are made worse with cough.  Denies previous symptoms in the past. Complains of associated fatigue.  Denies fever, chills, sore throat, SOB, wheezing, chest pain, changes in bladder habits.    Smokes 1 PPD x 20 years  ROS: As per HPI.  All other pertinent ROS negative.     Past Medical History:  Diagnosis Date  . Back pain    History reviewed. No pertinent surgical history. No Known Allergies No current facility-administered medications on file prior to encounter.    Current Outpatient Medications on File Prior to Encounter  Medication Sig Dispense Refill  . acetaminophen-codeine (TYLENOL #3) 300-30 MG tablet One tablet every four hours as needed for pain.  Must last TEN days. 40 tablet 2   Social History   Socioeconomic History  . Marital status: Married    Spouse name: Not on file  . Number of children: Not on file  . Years of education: Not on file  . Highest education level: Not on file  Occupational History  . Not on file  Social Needs  . Financial resource strain: Not on file  . Food insecurity    Worry: Not on file    Inability: Not on file  . Transportation needs    Medical: Not on file    Non-medical: Not on file  Tobacco Use  . Smoking status: Current Every Day Smoker    Packs/day: 1.00    Types: Cigarettes  . Smokeless tobacco: Never Used  Substance and Sexual Activity  . Alcohol use: No  . Drug use: No  . Sexual activity: Not on file  Lifestyle  . Physical activity    Days per week: Not on file    Minutes per  session: Not on file  . Stress: Not on file  Relationships  . Social Herbalist on phone: Not on file    Gets together: Not on file    Attends religious service: Not on file    Active member of club or organization: Not on file    Attends meetings of clubs or organizations: Not on file    Relationship status: Not on file  . Intimate partner violence    Fear of current or ex partner: Not on file    Emotionally abused: Not on file    Physically abused: Not on file    Forced sexual activity: Not on file  Other Topics Concern  . Not on file  Social History Narrative  . Not on file   Family History  Problem Relation Age of Onset  . Healthy Mother   . Healthy Father     OBJECTIVE:  Vitals:   10/06/19 1606  BP: (!) 141/80  Pulse: 80  Resp: 16  Temp: 98 F (36.7 C)  TempSrc: Oral  SpO2: 96%     General appearance: alert; appears fatigued, but nontoxic; speaking in full sentences and tolerating own secretions HEENT: NCAT; Ears: EACs clear, TMs pearly gray; Eyes: PERRL.  EOM grossly intact. Nose: nares patent without rhinorrhea, Throat:  oropharynx clear, tonsils non erythematous or enlarged, uvula midline  Neck: supple without LAD Lungs: unlabored respirations, symmetrical air entry; cough: mild; no respiratory distress; CTAB Heart: regular rate and rhythm.   Abdomen: soft, nondistended, normal active bowel sounds; nontender to palpation; no guarding  Skin: warm and dry Psychological: alert and cooperative; normal mood and affect  LABS:  Results for orders placed or performed during the hospital encounter of 10/06/19 (from the past 24 hour(s))  POC SARS Coronavirus 2 Ag-ED - Nasal Swab (BD Veritor Kit)     Status: None   Collection Time: 10/06/19  4:13 PM  Result Value Ref Range   SARS Coronavirus 2 Ag Negative Negative     ASSESSMENT & PLAN:  1. Suspected COVID-19 virus infection   2. Encounter for laboratory testing for COVID-19 virus   3.  Non-intractable vomiting with nausea, unspecified vomiting type     Meds ordered this encounter  Medications  . benzonatate (TESSALON) 100 MG capsule    Sig: Take 1 capsule (100 mg total) by mouth every 8 (eight) hours.    Dispense:  21 capsule    Refill:  0    Order Specific Question:   Supervising Provider    Answer:   Raylene Everts [[id number]]  . cetirizine (ZYRTEC) 10 MG chewable tablet    Sig: Chew 1 tablet (10 mg total) by mouth daily.    Dispense:  20 tablet    Refill:  0    Order Specific Question:   Supervising Provider    Answer:   Raylene Everts [[id number]]  . fluticasone (FLONASE) 50 MCG/ACT nasal spray    Sig: Place 2 sprays into both nostrils daily.    Dispense:  16 g    Refill:  0    Order Specific Question:   Supervising Provider    Answer:   Raylene Everts [[id number]]   Rapid COVID negative.  Culture sent.  Patient should remain in quarantine until they have received culture results.  If negative you may resume normal activities (go back to work/school) while practicing hand hygiene, social distance, and mask wearing.  If positive, patient should remain in quarantine for 10 days from symptom onset AND greater than 72 hours after symptoms resolution (absence of fever without the use of fever-reducing medication and improvement in respiratory symptoms), whichever is longer Get plenty of rest and push fluids Use OTC zyrtec for nasal congestion, runny nose, and/or sore throat Use OTC flonase for nasal congestion and runny nose Tessalon for cough Use medications daily for symptom relief Use OTC medications like ibuprofen or tylenol as needed fever or pain Follow up with PCP in 1-2 days Call or go to the ED if you have any new or worsening symptoms such as fever, worsening cough, shortness of breath, chest tightness, chest pain, turning blue, changes in mental status, etc...     Reviewed expectations re: course of current medical issues. Questions answered.  Outlined signs and symptoms indicating need for more acute intervention. Patient verbalized understanding. After Visit Summary given.         Lestine Box, PA-C 10/06/19 2021

## 2019-10-06 NOTE — ED Triage Notes (Signed)
Pt presents to UC stating w/ c/o coughing, headache, runny nose, sore throat x4 days.

## 2019-10-07 LAB — NOVEL CORONAVIRUS, NAA: SARS-CoV-2, NAA: NOT DETECTED

## 2019-12-08 ENCOUNTER — Ambulatory Visit: Payer: Medicaid Other

## 2019-12-09 ENCOUNTER — Ambulatory Visit: Payer: Medicaid Other | Attending: Internal Medicine

## 2019-12-09 ENCOUNTER — Other Ambulatory Visit: Payer: Self-pay

## 2019-12-09 ENCOUNTER — Other Ambulatory Visit: Payer: Medicaid Other

## 2019-12-09 DIAGNOSIS — Z20822 Contact with and (suspected) exposure to covid-19: Secondary | ICD-10-CM

## 2019-12-10 LAB — NOVEL CORONAVIRUS, NAA: SARS-CoV-2, NAA: NOT DETECTED

## 2019-12-12 ENCOUNTER — Telehealth: Payer: Self-pay | Admitting: *Deleted

## 2019-12-12 NOTE — Telephone Encounter (Signed)
Patient notified of negative Covid-19 results from 12/09/19. Understanding verbalized.

## 2020-08-13 ENCOUNTER — Ambulatory Visit
Admission: EM | Admit: 2020-08-13 | Discharge: 2020-08-13 | Disposition: A | Discharge status: Home / Self Care | Payer: Medicaid Other

## 2020-08-13 ENCOUNTER — Other Ambulatory Visit: Payer: Self-pay

## 2020-08-13 DIAGNOSIS — Z1152 Encounter for screening for COVID-19: Secondary | ICD-10-CM

## 2020-08-13 DIAGNOSIS — R079 Chest pain, unspecified: Secondary | ICD-10-CM

## 2020-08-13 DIAGNOSIS — J069 Acute upper respiratory infection, unspecified: Secondary | ICD-10-CM | POA: Diagnosis not present

## 2020-08-13 DIAGNOSIS — Z20822 Contact with and (suspected) exposure to covid-19: Secondary | ICD-10-CM

## 2020-08-13 DIAGNOSIS — R062 Wheezing: Secondary | ICD-10-CM

## 2020-08-13 DIAGNOSIS — J209 Acute bronchitis, unspecified: Secondary | ICD-10-CM

## 2020-08-13 MED ORDER — PREDNISONE 10 MG (21) PO TBPK: ORAL_TABLET | Freq: Every day | ORAL | 0 refills | Status: DC

## 2020-08-13 MED ORDER — BENZONATATE 100 MG PO CAPS: 100.0000 mg | ORAL_CAPSULE | Freq: Three times a day (TID) | ORAL | 0 refills | Status: DC

## 2020-08-13 MED ORDER — ALBUTEROL SULFATE HFA 108 (90 BASE) MCG/ACT IN AERS
2.0000 | INHALATION_SPRAY | Freq: Once | RESPIRATORY_TRACT | Status: AC
  Administered 2020-08-13: 2 via RESPIRATORY_TRACT

## 2020-08-13 NOTE — Discharge Instructions (Signed)
Unable to rule out cardiac disease or blood clot in urgent care setting.  Offered patient further evaluation and management in the ED for CP and SOB.  Patient declines at this time and would like to try outpatient therapy first.  Aware of the risk associated with this decision including missed diagnosis, organ damage, organ failure, and/or death.  Patient aware and in agreement.     COVID testing ordered.  It will take between 5-7 days for test results.  Someone will contact you regarding abnormal results.   In the meantime: You should remain isolated in your home for 10 days from symptom onset AND greater than 72 hours after symptoms resolution (absence of fever without the use of fever-reducing medication and improvement in respiratory symptoms), whichever is longer Get plenty of rest and push fluids Albuterol inhaler given in office Prednisone prescribed Use OTC zyrtec for nasal congestion, runny nose, and/or sore throat Use OTC flonase for nasal congestion and runny nose Use medications daily for symptom relief Use OTC medications like ibuprofen or tylenol as needed fever or pain Call 911 or go to the ED if you have any new or worsening symptoms such as fever, worsening cough, shortness of breath, chest tightness, chest pain, turning blue, changes in mental status, etc..Marland Kitchen

## 2020-08-13 NOTE — ED Provider Notes (Signed)
[name]   [id number] 08/13/20 Arrival Time: 1422   CC: COVID symptoms  SUBJECTIVE: History from: patient.  Daniel Gonzales is a 34 y.o. male who presents with productive cough with clear, black and green sputum, nasal congestion, wheezing x 1 week.  Denies known sick exposure to COVID, flu or strep.  Family with similar symptoms.  Has tried OTC medications without relief.  Symptoms are made worse with at night.  Reports previous symptoms in the past that resolved without intervention.  Denies previous COVID infection.  Complains of associated runny nose, wheezing, tobacco [place], SOB, CP, N/V/D.   Denies fever, chills, sinus pain, sore throat   ROS: As per HPI.  All other pertinent ROS negative.     Past Medical History:  Diagnosis Date  . Back pain    History reviewed. No pertinent surgical history. No Known Allergies No current facility-administered medications on file prior to encounter.   Current Outpatient Medications on File Prior to Encounter  Medication Sig Dispense Refill  . acetaminophen-codeine (TYLENOL #3) 300-30 MG tablet One tablet every four hours as needed for pain.  Must last TEN days. 40 tablet 2  . cetirizine (ZYRTEC) 10 MG chewable tablet Chew 1 tablet (10 mg total) by mouth daily. 20 tablet 0  . fluticasone (FLONASE) 50 MCG/ACT nasal spray Place 2 sprays into both nostrils daily. 16 g 0  . penicillin v potassium (VEETID) 500 MG tablet Take 500 mg by mouth 3 (three) times daily.     Social History   Socioeconomic History  . Marital status: Married    Spouse name: Not on file  . Number of children: Not on file  . Years of education: Not on file  . Highest education level: Not on file  Occupational History  . Not on file  Tobacco Use  . Smoking status: Current Every Day Smoker    Packs/day: 1.00    Types: Cigarettes  . Smokeless tobacco: Never Used  Substance and Sexual Activity  . Alcohol use: No  . Drug use: No  . Sexual activity: Not  on file  Other Topics Concern  . Not on file  Social History Narrative  . Not on file   Social Determinants of Health   Financial Resource Strain:   . Difficulty of Paying Living Expenses: Not on file  Food Insecurity:   . Worried About Programme researcher, broadcasting/film/video in the Last Year: Not on file  . Ran Out of Food in the Last Year: Not on file  Transportation Needs:   . Lack of Transportation (Medical): Not on file  . Lack of Transportation (Non-Medical): Not on file  Physical Activity:   . Days of Exercise per Week: Not on file  . Minutes of Exercise per Session: Not on file  Stress:   . Feeling of Stress : Not on file  Social Connections:   . Frequency of Communication with Friends and Family: Not on file  . Frequency of Social Gatherings with Friends and Family: Not on file  . Attends Religious Services: Not on file  . Active Member of Clubs or Organizations: Not on file  . Attends Banker Meetings: Not on file  . Marital Status: Not on file  Intimate Partner Violence:   . Fear of Current or Ex-Partner: Not on file  . Emotionally Abused: Not on file  . Physically Abused: Not on file  . Sexually Abused: Not on file   Family History  Problem Relation Age  of Onset  . Healthy Mother   . Healthy Father     OBJECTIVE:  Vitals:   08/13/20 1502  BP: (!) 142/90  Pulse: 81  Resp: 20  Temp: 98.4 F (36.9 C)  SpO2: 96%     General appearance: alert; appears fatigued, but nontoxic; speaking in full sentences and tolerating own secretions HEENT: NCAT; Ears: EACs clear, TMs pearly gray; Eyes: PERRL.  EOM grossly intact. Nose: nares patent without rhinorrhea, Throat: oropharynx clear, tonsils non erythematous or enlarged, uvula midline  Neck: supple without LAD Lungs: unlabored respirations, symmetrical air entry; cough: mild; no respiratory distress; subtle wheezes Heart: regular rate and rhythm.   Skin: warm and dry Psychological: alert and cooperative; normal mood  and affect   ASSESSMENT & PLAN:  1. Encounter for screening for COVID-19   2. Viral URI with cough   3. Wheezing   4. Chest pain, unspecified type   5. Suspected COVID-19 virus infection   6. Acute bronchitis, unspecified organism     Meds ordered this encounter  Medications  . benzonatate (TESSALON) 100 MG capsule    Sig: Take 1 capsule (100 mg total) by mouth every 8 (eight) hours.    Dispense:  21 capsule    Refill:  0    Order Specific Question:   Supervising Provider    Answer:   Eustace Moore [[id number]]  . predniSONE (STERAPRED UNI-PAK 21 TAB) 10 MG (21) TBPK tablet    Sig: Take by mouth daily. Take 6 tabs by mouth daily  for 2 days, then 5 tabs for 2 days, then 4 tabs for 2 days, then 3 tabs for 2 days, 2 tabs for 2 days, then 1 tab by mouth daily for 2 days    Dispense:  42 tablet    Refill:  0    Order Specific Question:   Supervising Provider    Answer:   Eustace Moore [[id number]]  . albuterol (VENTOLIN HFA) 108 (90 Base) MCG/ACT inhaler 2 puff   Unable to rule out cardiac disease or blood clot in urgent care setting.  Offered patient further evaluation and management in the ED for CP and SOB.  Patient declines at this time and would like to try outpatient therapy first.  Aware of the risk associated with this decision including missed diagnosis, organ damage, organ failure, and/or death.  Patient aware and in agreement.     COVID testing ordered.  It will take between 5-7 days for test results.  Someone will contact you regarding abnormal results.   In the meantime: You should remain isolated in your home for 10 days from symptom onset AND greater than 72 hours after symptoms resolution (absence of fever without the use of fever-reducing medication and improvement in respiratory symptoms), whichever is longer Get plenty of rest and push fluids Albuterol inhaler given in office Prednisone prescribed Use OTC zyrtec for nasal congestion, runny nose, and/or sore  throat Use OTC flonase for nasal congestion and runny nose Use medications daily for symptom relief Use OTC medications like ibuprofen or tylenol as needed fever or pain Call 911 or go to the ED if you have any new or worsening symptoms such as fever, worsening cough, shortness of breath, chest tightness, chest pain, turning blue, changes in mental status, etc...   Reviewed expectations re: course of current medical issues. Questions answered. Outlined signs and symptoms indicating need for more acute intervention. Patient verbalized understanding. After Visit Summary given.  Rennis Harding, PA-C 08/13/20 1551

## 2020-08-14 LAB — NOVEL CORONAVIRUS, NAA: SARS-CoV-2, NAA: NOT DETECTED

## 2020-08-14 LAB — SARS-COV-2, NAA 2 DAY TAT

## 2020-08-31 ENCOUNTER — Ambulatory Visit
Admission: RE | Admit: 2020-08-31 | Discharge: 2020-08-31 | Disposition: A | Discharge status: Home / Self Care | Payer: Medicaid Other | Source: Ambulatory Visit | Attending: Emergency Medicine | Admitting: Emergency Medicine

## 2020-08-31 ENCOUNTER — Encounter (HOSPITAL_COMMUNITY): Payer: Self-pay

## 2020-08-31 ENCOUNTER — Other Ambulatory Visit: Payer: Self-pay

## 2020-08-31 ENCOUNTER — Emergency Department (HOSPITAL_COMMUNITY)
Admission: EM | Admit: 2020-08-31 | Discharge: 2020-08-31 | Disposition: A | Discharge status: Home / Self Care | Payer: Medicaid Other | Attending: Emergency Medicine | Admitting: Emergency Medicine

## 2020-08-31 VITALS — BP 147/105 | HR 85 | Temp 98.1°F | Resp 20

## 2020-08-31 DIAGNOSIS — R3911 Hesitancy of micturition: Secondary | ICD-10-CM | POA: Insufficient documentation

## 2020-08-31 DIAGNOSIS — F17210 Nicotine dependence, cigarettes, uncomplicated: Secondary | ICD-10-CM | POA: Insufficient documentation

## 2020-08-31 DIAGNOSIS — Z205 Contact with and (suspected) exposure to viral hepatitis: Secondary | ICD-10-CM | POA: Diagnosis present

## 2020-08-31 DIAGNOSIS — Z206 Contact with and (suspected) exposure to human immunodeficiency virus [HIV]: Secondary | ICD-10-CM | POA: Diagnosis not present

## 2020-08-31 DIAGNOSIS — Z113 Encounter for screening for infections with a predominantly sexual mode of transmission: Secondary | ICD-10-CM

## 2020-08-31 HISTORY — DX: Opioid dependence, uncomplicated: F11.20

## 2020-08-31 LAB — HEPATITIS PANEL, ACUTE
HCV Ab: NONREACTIVE
Hep A IgM: NONREACTIVE
Hep B C IgM: NONREACTIVE
Hepatitis B Surface Ag: NONREACTIVE

## 2020-08-31 LAB — COMPREHENSIVE METABOLIC PANEL
ALT: 19 U/L (ref 0–44)
AST: 17 U/L (ref 15–41)
Albumin: 4.3 g/dL (ref 3.5–5.0)
Alkaline Phosphatase: 47 U/L (ref 38–126)
Anion gap: 7 (ref 5–15)
BUN: 11 mg/dL (ref 6–20)
CO2: 27 mmol/L (ref 22–32)
Calcium: 9.4 mg/dL (ref 8.9–10.3)
Chloride: 98 mmol/L (ref 98–111)
Creatinine, Ser: 0.94 mg/dL (ref 0.61–1.24)
GFR, Estimated: >60 mL/min (ref >60)
Glucose, Bld: 98 mg/dL (ref 70–99)
Potassium: 4.4 mmol/L (ref 3.5–5.1)
Sodium: 132 mmol/L — ABNORMAL LOW (ref 135–145)
Total Bilirubin: 1.2 mg/dL (ref 0.3–1.2)
Total Protein: 7.7 g/dL (ref 6.5–8.1)

## 2020-08-31 LAB — URINALYSIS, ROUTINE W REFLEX MICROSCOPIC
Bacteria, UA: NONE SEEN
Bilirubin Urine: NEGATIVE
Glucose, UA: NEGATIVE mg/dL
Ketones, ur: NEGATIVE mg/dL
Leukocytes,Ua: NEGATIVE
Nitrite: NEGATIVE
Protein, ur: NEGATIVE mg/dL
Specific Gravity, Urine: 1.02 (ref 1.005–1.030)
pH: 5 (ref 5.0–8.0)

## 2020-08-31 NOTE — ED Triage Notes (Signed)
Pt wants to get tested for hepatitis. States his girlfriend shot up meth with someone who had Hep C.

## 2020-08-31 NOTE — ED Provider Notes (Signed)
[facility] EMERGENCY DEPARTMENT Provider Note   CSN: [id number] Arrival date & time: 08/31/20  0940     History Chief Complaint  Patient presents with  . SEXUALLY TRANSMITTED DISEASE    Daniel Gonzales is a 34 y.o. male.  HPI   Patient with no significant medical history presents to the emergency department with chief complaint of possible exposure to hepatitis C.  Patient endorses that his girlfriend told him that she "shot up meth" with a girl who possibly had hep C.  He found this out today but states that this may have happened about 1 month ago.  He denies abdominal pain, nausea, vomiting, fatigue, fevers or chills or rash.  He was recently seen at urgent care earlier today where they screened him for HIV and syphilis.  He endorses that he has some urinary hesitancy but denies dysuria, hematuria, testicular pain, penile discharge, or lower back pain.  He states he is more concerned that the hep C.  He denies headache, fever, chills, shortness of breath, chest pain, dumping, nausea, vomiting, diarrhea, pedal edema.  Past Medical History:  Diagnosis Date  . Back pain   . Opiate addiction [facility])     Patient Active Problem List   Diagnosis Date Noted  . Neck pain 12/08/2015  . Smoker 12/08/2015  . Cervicalgia 07/14/2011  . Pain in joint, ankle and foot 05/19/2011    History reviewed. No pertinent surgical history.     Family History  Problem Relation Age of Onset  . Healthy Mother   . Healthy Father     Social History   Tobacco Use  . Smoking status: Current Every Day Smoker    Packs/day: 1.00    Types: Cigarettes  . Smokeless tobacco: Never Used  Substance Use Topics  . Alcohol use: Yes    Comment: 12 pack a week  . Drug use: Not Currently    Home Medications Prior to Admission medications   Medication Sig Start Date End Date Taking? Authorizing Provider  acetaminophen-codeine (TYLENOL #3) 300-30 MG tablet One tablet every four hours as needed for pain.   Must last TEN days. 03/02/16   Sanjuana Kava, MD  benzonatate (TESSALON) 100 MG capsule Take 1 capsule (100 mg total) by mouth every 8 (eight) hours. 08/13/20   Wurst, [place], PA-C  cetirizine (ZYRTEC) 10 MG chewable tablet Chew 1 tablet (10 mg total) by mouth daily. 10/06/19   Wurst, [place], PA-C  fluticasone (FLONASE) 50 MCG/ACT nasal spray Place 2 sprays into both nostrils daily. 10/06/19   Wurst, [place], PA-C  penicillin v potassium (VEETID) 500 MG tablet Take 500 mg by mouth 3 (three) times daily. 08/09/20   [provider]  predniSONE (STERAPRED UNI-PAK 21 TAB) 10 MG (21) TBPK tablet Take by mouth daily. Take 6 tabs by mouth daily  for 2 days, then 5 tabs for 2 days, then 4 tabs for 2 days, then 3 tabs for 2 days, 2 tabs for 2 days, then 1 tab by mouth daily for 2 days 08/13/20   Stacey Drain [place], PA-C    Allergies    Patient has no known allergies.  Review of Systems   Review of Systems  Constitutional: Negative for chills and fever.  HENT: Negative for congestion, tinnitus, trouble swallowing and voice change.   Eyes: Negative for visual disturbance.  Respiratory: Negative for shortness of breath.   Cardiovascular: Negative for chest pain.  Gastrointestinal: Negative for abdominal pain, diarrhea, nausea and vomiting.  Genitourinary: Positive for difficulty  urinating. Negative for decreased urine volume, dysuria, enuresis, flank pain and urgency.  Musculoskeletal: Negative for back pain.  Skin: Negative for rash.  Neurological: Negative for dizziness and headaches.  Hematological: Does not bruise/bleed easily.    Physical Exam Updated Vital Signs BP (!) 155/106 (BP Location: Right Arm)   Pulse 85   Temp 98.6 F (37 C) (Oral)   Resp 18   Ht 6' (1.829 m)   Wt 86.2 kg   SpO2 98%   BMI 25.77 kg/m   Physical Exam Vitals and nursing note reviewed. Exam conducted with a chaperone present.  Constitutional:      General: He is not in acute distress.    Appearance:  He is not ill-appearing.  HENT:     Head: Normocephalic and atraumatic.     Nose: No congestion.     Mouth/Throat:     Mouth: Mucous membranes are moist.     Pharynx: Oropharynx is clear. No oropharyngeal exudate or posterior oropharyngeal erythema.  Eyes:     General: No scleral icterus. Cardiovascular:     Rate and Rhythm: Normal rate and regular rhythm.     Pulses: Normal pulses.     Heart sounds: No murmur heard.  No friction rub. No gallop.   Pulmonary:     Effort: No respiratory distress.     Breath sounds: No wheezing, rhonchi or rales.  Abdominal:     General: There is no distension.     Palpations: Abdomen is soft.     Tenderness: There is no abdominal tenderness. There is no right CVA tenderness, left CVA tenderness or guarding.  Genitourinary:    Penis: Normal.      Testes: Normal.     Comments: With a chaperone genital exam was performed no rashes, papules, discharge noted.  Testicles were examined there were nontender to palpation, no bell clap deformity noted.  Cremaster reflex was intact. Musculoskeletal:        General: No swelling.  Skin:    General: Skin is warm and dry.     Findings: No lesion or rash.  Neurological:     Mental Status: He is alert.  Psychiatric:        Mood and Affect: Mood normal.     ED Results / Procedures / Treatments   Labs (all labs ordered are listed, but only abnormal results are displayed) Labs Reviewed  URINALYSIS, ROUTINE W REFLEX MICROSCOPIC - Abnormal; Notable for the following components:      Result Value   APPearance HAZY (*)    Hgb urine dipstick SMALL (*)    All other components within normal limits  COMPREHENSIVE METABOLIC PANEL - Abnormal; Notable for the following components:   Sodium 132 (*)    All other components within normal limits  HEPATITIS PANEL, ACUTE  GC/CHLAMYDIA PROBE AMP ([place]) NOT AT [facility]    EKG None  Radiology No results found.  Procedures Procedures (including critical care  time)  Medications Ordered in ED Medications - No data to display  ED Course  I have reviewed the triage vital signs and the nursing notes.  Pertinent labs & imaging results that were available during my care of the patient were reviewed by me and considered in my medical decision making (see chart for details).    MDM Rules/Calculators/A&P                          I have personally reviewed all imaging,  labs and have interpreted them.  Patient presents with chief complaint of hepatitis exposure.  He is alert, did not appear in acute distress, vital signs reassuring.  Will order hepatitis panel, CMP, gonorrhea chlamydia.  CMP shows hyponatremia, no metabolic acidosis, no AKI, no elevated liver enzymes, no anion gap noted.  UA negative for nitrates or leukocytes, no hematuria noted.  Low suspicion for pyelonephritis or UTI as patient denies urinary symptoms, lower back pain, nausea vomiting fevers or chills, UA negative for nitrates or leukocytes no hematuria noted.  Low suspicion for epididymitis or prostatitis as patient denies saddle pain, testicles are nontender to palpation.  Low suspicion for herpes as there is no noted rash on patient's genitalia.  Low suspicion for STD as patient denies penile discharge, testicular pain, or rash around the genitalia..  Low suspicion for systemic infection as patient is nontoxic-appearing, vital signs reassuring, no obvious source infection noted on exam.  Will defer STD treatment at this time.  Low suspicion for hepatic failure as patient had no right upper quadrant pain, no elevated liver enzymes or alk phos noted on CMP.  Will recommend patient abstain from sexual intercourse until his results are back and encourage sex safe practices.  Will have patient follow-up at his PCP for review of his hepatitis panel.   Vital signs have remained stable, no indication for hospital admission.  Patient given at home care as well strict return precautions.  Patient  verbalized that they understood agreed to said plan.   Final Clinical Impression(s) / ED Diagnoses Final diagnoses:  Exposure to hepatitis C    Rx / DC Orders ED Discharge Orders    None       Marcello Fennel, PA-C 08/31/20 1505    Elnora Morrison, MD 09/01/20 1324

## 2020-08-31 NOTE — ED Triage Notes (Signed)
Pt wants to get tested for hepatitis, advised would be unable to have hep panel done here, but is requesting HIV screening

## 2020-08-31 NOTE — Discharge Instructions (Addendum)
HIV testing today Go to the health department to have hepatitis panel completed We will follow up with you regarding the results of your test If tests are positive, please abstain from sexual activity until you and your partner(s) are treated Follow up with PCP or [facility] if symptoms persists Return here or go to ER if you have any new or worsening symptoms

## 2020-08-31 NOTE — ED Provider Notes (Signed)
[facility] CARE CENTER   [id number] 08/31/20 Arrival Time: 0842   OB:SJGGEZM FOR STD  SUBJECTIVE:  Daniel Gonzales is a 34 y.o. male who presents requesting STI screening.  Currently asymptomatic.  Partner asymptomatic.  Last unprotected sexual encounter within few days.  Sexually active with multiple male partner.  Denies similar symptoms in the past.  Denies fever, chills, nausea, vomiting, abdominal or pelvic pain, urinary symptoms.   No LMP for male patient.  ROS: As per HPI.  All other pertinent ROS negative.      Past Medical History:  Diagnosis Date  . Back pain    History reviewed. No pertinent surgical history. No Known Allergies No current facility-administered medications on file prior to encounter.   Current Outpatient Medications on File Prior to Encounter  Medication Sig Dispense Refill  . acetaminophen-codeine (TYLENOL #3) 300-30 MG tablet One tablet every four hours as needed for pain.  Must last TEN days. 40 tablet 2  . benzonatate (TESSALON) 100 MG capsule Take 1 capsule (100 mg total) by mouth every 8 (eight) hours. 21 capsule 0  . cetirizine (ZYRTEC) 10 MG chewable tablet Chew 1 tablet (10 mg total) by mouth daily. 20 tablet 0  . fluticasone (FLONASE) 50 MCG/ACT nasal spray Place 2 sprays into both nostrils daily. 16 g 0  . penicillin v potassium (VEETID) 500 MG tablet Take 500 mg by mouth 3 (three) times daily.    . predniSONE (STERAPRED UNI-PAK 21 TAB) 10 MG (21) TBPK tablet Take by mouth daily. Take 6 tabs by mouth daily  for 2 days, then 5 tabs for 2 days, then 4 tabs for 2 days, then 3 tabs for 2 days, 2 tabs for 2 days, then 1 tab by mouth daily for 2 days 42 tablet 0   Social History   Socioeconomic History  . Marital status: Married    Spouse name: Not on file  . Number of children: Not on file  . Years of education: Not on file  . Highest education level: Not on file  Occupational History  . Not on file  Tobacco Use  . Smoking status:  Current Every Day Smoker    Packs/day: 1.00    Types: Cigarettes  . Smokeless tobacco: Never Used  Substance and Sexual Activity  . Alcohol use: No  . Drug use: No  . Sexual activity: Not on file  Other Topics Concern  . Not on file  Social History Narrative  . Not on file   Social Determinants of Health   Financial Resource Strain:   . Difficulty of Paying Living Expenses: Not on file  Food Insecurity:   . Worried About Programme researcher, broadcasting/film/video in the Last Year: Not on file  . Ran Out of Food in the Last Year: Not on file  Transportation Needs:   . Lack of Transportation (Medical): Not on file  . Lack of Transportation (Non-Medical): Not on file  Physical Activity:   . Days of Exercise per Week: Not on file  . Minutes of Exercise per Session: Not on file  Stress:   . Feeling of Stress : Not on file  Social Connections:   . Frequency of Communication with Friends and Family: Not on file  . Frequency of Social Gatherings with Friends and Family: Not on file  . Attends Religious Services: Not on file  . Active Member of Clubs or Organizations: Not on file  . Attends Banker Meetings: Not on file  . Marital  Status: Not on file  Intimate Partner Violence:   . Fear of Current or Ex-Partner: Not on file  . Emotionally Abused: Not on file  . Physically Abused: Not on file  . Sexually Abused: Not on file   Family History  Problem Relation Age of Onset  . Healthy Mother   . Healthy Father     OBJECTIVE:  Vitals:   08/31/20 0854  BP: (!) 147/105  Pulse: 85  Resp: 20  Temp: 98.1 F (36.7 C)  SpO2: 97%     General appearance: alert, NAD, appears stated age Head: NCAT Throat: lips, mucosa, and tongue normal; teeth and gums normal Lungs: CTA bilaterally without adventitious breath sounds Heart: regular rate and rhythm.  Radial pulses 2+ symmetrical bilaterally Back: no CVA tenderness Abdomen: soft, non-tender; bowel sounds normal; no masses or organomegaly;  no guarding or rebound tenderness GU: deferred Skin: warm and dry Psychological:  Alert and cooperative. Normal mood and affect.  LABS:  Results for orders placed or performed during the hospital encounter of 08/13/20  Novel Coronavirus, NAA ([facility])   Specimen: Nasopharyngeal Swab; Nasopharyngeal(NP) swabs in vial transport medium   Nasopharynge  Result Value Ref Range   SARS-CoV-2, NAA Not Detected Not Detected  SARS-COV-2, NAA 2 DAY TAT   Nasopharynge  Result Value Ref Range   SARS-CoV-2, NAA 2 DAY TAT Performed     Labs Reviewed - No data to display  ASSESSMENT & PLAN:  1. HIV exposure   2. Screening examination for STD (sexually transmitted disease)     No orders of the defined types were placed in this encounter.   Pending: Labs Reviewed - No data to display  Discharge instructions  HIV testing today Go to the health department to have hepatitis panel completed We will follow up with you regarding the results of your test If tests are positive, please abstain from sexual activity until you and your partner(s) are treated Follow up with PCP or [facility] if symptoms persists Return here or go to ER if you have any new or worsening symptoms    Reviewed expectations re: course of current medical issues. Questions answered. Outlined signs and symptoms indicating need for more acute intervention. Patient verbalized understanding. After Visit Summary given.       Durward Parcel, FNP 08/31/20 [phone]

## 2020-08-31 NOTE — Discharge Instructions (Addendum)
Seen here for possible exposure to hepatitis.  Your hepatitis panel and gonorrhea chlamydia tests are pending at this time.  I recommend abstaining from sexual intercourse until you get your results back.  If any of these results are positive the hospital will contact you and provide you with more information.  I recommend safe sex practices to help prevent future exposures.  Please follow-up with the health department for future STD exposures in your PCP for review of your hepatitis panel.  Come back to the emergency department if you develop chest pain, shortness of breath, severe abdominal pain, uncontrolled nausea, vomiting, diarrhea.

## 2020-09-01 LAB — HIV ANTIBODY (ROUTINE TESTING W REFLEX): HIV Screen 4th Generation wRfx: NONREACTIVE

## 2020-09-01 LAB — RPR: RPR Ser Ql: NONREACTIVE

## 2020-10-07 ENCOUNTER — Encounter: Payer: Self-pay | Admitting: Emergency Medicine

## 2020-10-07 ENCOUNTER — Other Ambulatory Visit: Payer: Self-pay

## 2020-10-07 ENCOUNTER — Ambulatory Visit
Admission: EM | Admit: 2020-10-07 | Discharge: 2020-10-07 | Disposition: A | Discharge status: Home / Self Care | Payer: Medicaid Other | Attending: Emergency Medicine | Admitting: Emergency Medicine

## 2020-10-07 DIAGNOSIS — Z1152 Encounter for screening for COVID-19: Secondary | ICD-10-CM | POA: Diagnosis present

## 2020-10-07 DIAGNOSIS — R509 Fever, unspecified: Secondary | ICD-10-CM

## 2020-10-07 DIAGNOSIS — J069 Acute upper respiratory infection, unspecified: Secondary | ICD-10-CM | POA: Diagnosis present

## 2020-10-07 DIAGNOSIS — R300 Dysuria: Secondary | ICD-10-CM | POA: Insufficient documentation

## 2020-10-07 LAB — POCT URINALYSIS DIP (MANUAL ENTRY)
Bilirubin, UA: NEGATIVE
Blood, UA: NEGATIVE
Glucose, UA: NEGATIVE mg/dL
Leukocytes, UA: NEGATIVE
Nitrite, UA: NEGATIVE
Spec Grav, UA: >=1.03 — AB (ref 1.010–1.025)
Urobilinogen, UA: 0.2 E.U./dL
pH, UA: 6 (ref 5.0–8.0)

## 2020-10-07 MED ORDER — BENZONATATE 100 MG PO CAPS: 100.0000 mg | ORAL_CAPSULE | Freq: Three times a day (TID) | ORAL | 0 refills | Status: DC

## 2020-10-07 MED ORDER — CETIRIZINE HCL 10 MG PO TABS: 10.0000 mg | ORAL_TABLET | Freq: Every day | ORAL | 0 refills | Status: DC

## 2020-10-07 MED ORDER — DEXAMETHASONE 4 MG PO TABS: 4.0000 mg | ORAL_TABLET | Freq: Every day | ORAL | 0 refills | Status: AC

## 2020-10-07 MED ORDER — FLUTICASONE PROPIONATE 50 MCG/ACT NA SUSP: 1.0000 | Freq: Every day | NASAL | 0 refills | Status: DC

## 2020-10-07 NOTE — Discharge Instructions (Addendum)
Urine analysis was negative for UTI. Sample will be sent for culture and someone will call you if your result is abnormal  COVID testing ordered.  It will take between 2-7 days for test results.  Someone will contact you regarding abnormal results.    In the meantime: You should remain isolated in your home for 10 days from symptom onset AND greater than 24 hours after symptoms resolution (absence of fever without the use of fever-reducing medication and improvement in respiratory symptoms), whichever is longer Get plenty of rest and push fluids Tessalon Perles prescribed for cough Zyrtec for nasal congestion, runny nose, and/or sore throat Flonase for nasal congestion and runny nose Decadron was prescribed Use medications daily for symptom relief Use OTC medications like ibuprofen or tylenol as needed fever or pain Call or go to the ED if you have any new or worsening symptoms such as fever, worsening cough, shortness of breath, chest tightness, chest pain, turning blue, changes in mental status, etc..Marland Kitchen

## 2020-10-07 NOTE — ED Provider Notes (Signed)
[facility] CARE CENTER   [id number] 10/07/20 Arrival Time: 1347   CC: COVID symptoms         Dysuria  SUBJECTIVE: History from: patient.  Daniel Gonzales is a 34 y.o. male who presented to the urgent care for complaint of fever, cough and dysuria for the past 2 days.  Denies sick exposure to COVID, flu or strep.  Denies recent travel.  Has tried OTC medication without relief. Denies aggravating factors. Denies previous symptoms in the past.   Denies fever, chills, fatigue, sinus pain, rhinorrhea, sore throat, SOB, wheezing, chest pain, nausea, changes in bowel or bladder habits.     ROS: As per HPI.  All other pertinent ROS negative.       Past Medical History:  Diagnosis Date  . Back pain   . Opiate addiction (HCC)    History reviewed. No pertinent surgical history. No Known Allergies No current facility-administered medications on file prior to encounter.   Current Outpatient Medications on File Prior to Encounter  Medication Sig Dispense Refill  . acetaminophen-codeine (TYLENOL #3) 300-30 MG tablet One tablet every four hours as needed for pain.  Must last TEN days. 40 tablet 2  . penicillin v potassium (VEETID) 500 MG tablet Take 500 mg by mouth 3 (three) times daily.    . predniSONE (STERAPRED UNI-PAK 21 TAB) 10 MG (21) TBPK tablet Take by mouth daily. Take 6 tabs by mouth daily  for 2 days, then 5 tabs for 2 days, then 4 tabs for 2 days, then 3 tabs for 2 days, 2 tabs for 2 days, then 1 tab by mouth daily for 2 days 42 tablet 0   Social History   Socioeconomic History  . Marital status: Married    Spouse name: Not on file  . Number of children: Not on file  . Years of education: Not on file  . Highest education level: Not on file  Occupational History  . Not on file  Tobacco Use  . Smoking status: Current Every Day Smoker    Packs/day: 1.00    Types: Cigarettes  . Smokeless tobacco: Never Used  Substance and Sexual Activity  . Alcohol use: Yes    Comment: 12  pack a week  . Drug use: Not Currently  . Sexual activity: Not on file  Other Topics Concern  . Not on file  Social History Narrative  . Not on file   Social Determinants of Health   Financial Resource Strain: Not on file  Food Insecurity: Not on file  Transportation Needs: Not on file  Physical Activity: Not on file  Stress: Not on file  Social Connections: Not on file  Intimate Partner Violence: Not on file   Family History  Problem Relation Age of Onset  . Healthy Mother   . Healthy Father     OBJECTIVE:  Vitals:   10/07/20 1425 10/07/20 1426  BP:  118/77  Pulse:  89  Resp:  18  Temp:  98.2 F (36.8 C)  TempSrc:  Oral  Weight: 194 lb (88 kg)   Height: 6' (1.829 m)      General appearance: alert; appears fatigued, but nontoxic; speaking in full sentences and tolerating own secretions HEENT: NCAT; Ears: EACs clear, TMs pearly gray; Eyes: PERRL.  EOM grossly intact. Sinuses: nontender; Nose: nares patent without rhinorrhea, Throat: oropharynx clear, tonsils non erythematous or enlarged, uvula midline  Neck: supple without LAD Lungs: unlabored respirations, symmetrical air entry; cough: moderate; no respiratory distress; CTAB  Heart: regular rate and rhythm.  Radial pulses 2+ symmetrical bilaterally Skin: warm and dry Psychological: alert and cooperative; normal mood and affect  LABS:  Results for orders placed or performed during the hospital encounter of 10/07/20 (from the past 24 hour(s))  POCT urinalysis dipstick     Status: Abnormal   Collection Time: 10/07/20  3:24 PM  Result Value Ref Range   Color, UA straw (A) yellow   Clarity, UA clear clear   Glucose, UA negative negative mg/dL   Bilirubin, UA negative negative   Ketones, POC UA trace (5) (A) negative mg/dL   Spec Grav, UA >=8.527 (A) 1.010 - 1.025   Blood, UA negative negative   pH, UA 6.0 5.0 - 8.0   Protein Ur, POC trace (A) negative mg/dL   Urobilinogen, UA 0.2 0.2 or 1.0 E.U./dL   Nitrite,  UA Negative Negative   Leukocytes, UA Negative Negative     ASSESSMENT & PLAN:  1. Dysuria   2. Fever, unspecified   3. Viral URI with cough   4. Encounter for screening for COVID-19     Meds ordered this encounter  Medications  . cetirizine (ZYRTEC ALLERGY) 10 MG tablet    Sig: Take 1 tablet (10 mg total) by mouth daily.    Dispense:  30 tablet    Refill:  0  . fluticasone (FLONASE) 50 MCG/ACT nasal spray    Sig: Place 1 spray into both nostrils daily for 14 days.    Dispense:  16 g    Refill:  0  . dexamethasone (DECADRON) 4 MG tablet    Sig: Take 1 tablet (4 mg total) by mouth daily for 7 days.    Dispense:  7 tablet    Refill:  0  . benzonatate (TESSALON) 100 MG capsule    Sig: Take 1 capsule (100 mg total) by mouth every 8 (eight) hours.    Dispense:  30 capsule    Refill:  0   Discharge instructions  Urine analysis was negative for UTI. Sample will be sent for culture and someone will call you if your result is abnormal  COVID testing ordered.  It will take between 2-7 days for test results.  Someone will contact you regarding abnormal results.    In the meantime: You should remain isolated in your home for 10 days from symptom onset AND greater than 24 hours after symptoms resolution (absence of fever without the use of fever-reducing medication and improvement in respiratory symptoms), whichever is longer Get plenty of rest and push fluids Tessalon Perles prescribed for cough Zyrtec for nasal congestion, runny nose, and/or sore throat Flonase for nasal congestion and runny nose Decadron was prescribed Use medications daily for symptom relief Use OTC medications like ibuprofen or tylenol as needed fever or pain Call or go to the ED if you have any new or worsening symptoms such as fever, worsening cough, shortness of breath, chest tightness, chest pain, turning blue, changes in mental status, etc...   Reviewed expectations re: course of current medical issues.  Questions answered. Outlined signs and symptoms indicating need for more acute intervention. Patient verbalized understanding. After Visit Summary given.         Durward Parcel, FNP 10/07/20 1530

## 2020-10-07 NOTE — ED Triage Notes (Signed)
Fever x 2 days, states his temp last night was 102.7.  C/o cough.  Also c/o burning with urination x 2 days.

## 2020-10-09 LAB — URINE CULTURE

## 2020-10-09 LAB — COVID-19, FLU A+B NAA
Influenza A, NAA: NOT DETECTED
Influenza B, NAA: NOT DETECTED
SARS-CoV-2, NAA: NOT DETECTED

## 2020-11-01 ENCOUNTER — Ambulatory Visit: Payer: Self-pay

## 2020-11-02 ENCOUNTER — Ambulatory Visit: Payer: Self-pay

## 2020-11-04 ENCOUNTER — Ambulatory Visit
Admission: EM | Admit: 2020-11-04 | Discharge: 2020-11-04 | Disposition: A | Discharge status: Home / Self Care | Payer: Medicaid Other

## 2020-11-04 ENCOUNTER — Other Ambulatory Visit: Payer: Self-pay

## 2021-04-07 ENCOUNTER — Telehealth: Payer: Medicaid Other | Admitting: Physician Assistant

## 2021-04-07 ENCOUNTER — Encounter: Payer: Self-pay | Admitting: Physician Assistant

## 2021-04-07 DIAGNOSIS — K047 Periapical abscess without sinus: Secondary | ICD-10-CM

## 2021-04-07 MED ORDER — AMOXICILLIN 500 MG PO CAPS: 500.0000 mg | ORAL_CAPSULE | Freq: Three times a day (TID) | ORAL | 0 refills | Status: AC

## 2021-04-07 NOTE — Progress Notes (Signed)
Daniel Gonzales, Daniel Gonzales are scheduled for a virtual visit with your provider today.    Just as we do with appointments in the office, we must obtain your consent to participate.  Your consent will be active for this visit and any virtual visit you may have with one of our providers in the next 365 days.    If you have a MyChart account, I can also send a copy of this consent to you electronically.  All virtual visits are billed to your insurance company just like a traditional visit in the office.  As this is a virtual visit, video technology does not allow for your provider to perform a traditional examination.  This may limit your provider's ability to fully assess your condition.  If your provider identifies any concerns that need to be evaluated in person or the need to arrange testing such as labs, EKG, etc, we will make arrangements to do so.    Although advances in technology are sophisticated, we cannot ensure that it will always work on either your end or our end.  If the connection with a video visit is poor, we may have to switch to a telephone visit.  With either a video or telephone visit, we are not always able to ensure that we have a secure connection.   I need to obtain your verbal consent now.   Are you willing to proceed with your visit today?   Daniel Gonzales has provided verbal consent on 04/07/2021 for a virtual visit (video or telephone).   Daniel Loveless, PA-C 04/07/2021  2:06 PM    MyChart Video Visit    Virtual Visit via Video Note   This visit type was conducted due to national recommendations for restrictions regarding the COVID-19 Pandemic (e.g. social distancing) in an effort to limit this patient's exposure and mitigate transmission in our community. This patient is at least at moderate risk for complications without adequate follow up. This format is felt to be most appropriate for this patient at this time. Physical exam was limited by quality of the video and audio  technology used for the visit.   Patient location: Outside  Provider location: Home office in [place]  I discussed the limitations of evaluation and management by telemedicine and the availability of in person appointments. The patient expressed understanding and agreed to proceed.  Patient: Daniel Gonzales   DOB: 1985/12/10   35 y.o. Male  MRN: [id number] Visit Date: 04/07/2021  Today's healthcare provider: Margaretann Loveless, PA-C   No chief complaint on file.  Subjective    Dental Pain  This is a new problem. The current episode started in the past 7 days. The problem occurs constantly. The problem has been gradually worsening. The pain is moderate. Associated symptoms include facial pain. Pertinent negatives include no difficulty swallowing, fever, sinus pressure or thermal sensitivity. Associated symptoms comments: Slight sore throat and right ear pain associated with it. He has tried acetaminophen and NSAIDs (gargled with peroxide mouthwash) for the symptoms. The treatment provided no relief.   Lower right side, making jaw hurt.  Patient Active Problem List   Diagnosis Date Noted   Neck pain 12/08/2015   Smoker 12/08/2015   Cervicalgia 07/14/2011   Pain in joint, ankle and foot 05/19/2011   Past Medical History:  Diagnosis Date   Back pain    Opiate addiction (HCC)       Medications: Outpatient Medications Prior to Visit  Medication Sig   acetaminophen-codeine (  TYLENOL #3) 300-30 MG tablet One tablet every four hours as needed for pain.  Must last TEN days.   benzonatate (TESSALON) 100 MG capsule Take 1 capsule (100 mg total) by mouth every 8 (eight) hours.   cetirizine (ZYRTEC ALLERGY) 10 MG tablet Take 1 tablet (10 mg total) by mouth daily.   fluticasone (FLONASE) 50 MCG/ACT nasal spray Place 1 spray into both nostrils daily for 14 days.   penicillin v potassium (VEETID) 500 MG tablet Take 500 mg by mouth 3 (three) times daily.   predniSONE (STERAPRED UNI-PAK  21 TAB) 10 MG (21) TBPK tablet Take by mouth daily. Take 6 tabs by mouth daily  for 2 days, then 5 tabs for 2 days, then 4 tabs for 2 days, then 3 tabs for 2 days, 2 tabs for 2 days, then 1 tab by mouth daily for 2 days   No facility-administered medications prior to visit.    Review of Systems  Constitutional:  Negative for fever.  HENT:  Positive for dental problem and facial swelling. Negative for sinus pressure.   Respiratory: Negative.    Gastrointestinal: Negative.    Last CBC Lab Results  Component Value Date   WBC 10.9 (H) 06/02/2013   HGB 15.6 06/02/2013   HCT 45.9 06/02/2013   MCV 90.0 06/02/2013   MCH 30.6 06/02/2013   RDW 13.5 06/02/2013   PLT 218 06/02/2013   Last metabolic panel Lab Results  Component Value Date   GLUCOSE 98 08/31/2020   NA 132 (L) 08/31/2020   K 4.4 08/31/2020   CL 98 08/31/2020   CO2 27 08/31/2020   BUN 11 08/31/2020   CREATININE 0.94 08/31/2020   GFRNONAA >60 08/31/2020   GFRAA >90 06/02/2013   CALCIUM 9.4 08/31/2020   PROT 7.7 08/31/2020   ALBUMIN 4.3 08/31/2020   BILITOT 1.2 08/31/2020   ALKPHOS 47 08/31/2020   AST 17 08/31/2020   ALT 19 08/31/2020   ANIONGAP 7 08/31/2020      Objective    There were no vitals taken for this visit. BP Readings from Last 3 Encounters:  10/07/20 118/77  08/31/20 (!) 155/106  08/31/20 (!) 147/105   Wt Readings from Last 3 Encounters:  10/07/20 194 lb (88 kg)  08/31/20 190 lb (86.2 kg)  06/02/13 175 lb (79.4 kg)      Physical Exam Vitals reviewed.  Constitutional:      General: He is not in acute distress.    Appearance: Normal appearance. He is well-developed. He is not ill-appearing.  HENT:     Head: Normocephalic and atraumatic.  Eyes:     Conjunctiva/sclera: Conjunctivae normal.  Pulmonary:     Effort: Pulmonary effort is normal. No respiratory distress.  Musculoskeletal:     Cervical back: Normal range of motion and neck supple.  Neurological:     Mental Status: He is alert.   Psychiatric:        Mood and Affect: Mood normal.        Behavior: Behavior normal.        Thought Content: Thought content normal.        Judgment: Judgment normal.       Assessment & Plan     1. Dental abscess - Worsening - Will give amoxicillin as below - Discussed taking Ibuprofen 600-800mg  + Tylenol 1000mg  + oragel for pain - Avoid hot or cold foods or drink - Salt water gargles can help - Discussed local dentist for follow up ([facility] in [place]) - Seek in-person evaluation if not improving or if recurring - amoxicillin (AMOXIL) 500 MG capsule; Take 1 capsule (500 mg total) by mouth 3 (three) times daily for 10 days.  Dispense: 30 capsule; Refill: 0   No follow-ups on file.     I discussed the assessment and treatment plan with the patient. The patient was provided an opportunity to ask questions and all were answered. The patient agreed with the plan and demonstrated an understanding of the instructions.   The patient was advised to call back or seek an in-person evaluation if the symptoms worsen or if the condition fails to improve as anticipated.  I provided 16 minutes of face-to-face time during this encounter via MyChart Video enabled encounter.   Reine Just [facility] [phone] (phone) [phone] (fax)  [facility]

## 2021-04-07 NOTE — Patient Instructions (Signed)
Dental Abscess  A dental abscess is a collection of pus in or around a tooth that results from an infection. An abscess can cause pain in the affected area as well as other symptoms. Treatment is important to help with symptoms and to prevent the infection from spreading. What are the causes? This condition is caused by a bacterial infection around the root of the tooth that involves the inner part of the tooth (pulp). It may result from:  Severe tooth decay.  Trauma to the tooth, such as a broken or chipped tooth, that allows bacteria to enter into the pulp.  Severe gum disease around a tooth. What increases the risk? This condition is more likely to develop in males. It is also more likely to develop in people who:  Have dental decay (cavities).  Eat sugary snacks between meals.  Use tobacco products.  Have diabetes.  Have a weakened disease-fighting system (immune system).  Do not brush and care for their teeth regularly. What are the signs or symptoms? Symptoms of this condition include:  Severe pain in and around the infected tooth.  Swelling and redness around the infected tooth, in the mouth, or in the face.  Tenderness.  Pus drainage.  Bad breath.  Bitter taste in the mouth.  Difficulty swallowing.  Difficulty opening the mouth.  Nausea.  Vomiting.  Chills.  Swollen neck glands.  Fever. How is this diagnosed? This condition is diagnosed based on:  Your symptoms and your medical and dental history.  An examination of the infected tooth. During the exam, your dentist may tap on the infected tooth. You may also have X-rays of the affected area. How is this treated? This condition is treated by getting rid of the infection. This may be done with:  Incision and drainage. This procedure is done by making an incision in the abscess to drain out the pus. Removing pus is the first priority in treating an abscess.  Antibiotic medicines. These may be used  in certain situations.  Antibacterial mouth rinse.  A root canal. This may be performed to save the tooth. Your dentist accesses the visible part of your tooth (crown) with a drill and removes any damaged pulp. Then the space is filled and sealed off.  Tooth extraction. The tooth is pulled out if it cannot be saved by other treatment. You may also receive treatment for pain, such as:  Acetaminophen or NSAIDs.  Gels that contain a numbing medicine.  An injection to block the pain near your nerve. Follow these instructions at home: Medicines  Take over-the-counter and prescription medicines only as told by your dentist.  If you were prescribed an antibiotic, take it as told by your dentist. Do not stop taking the antibiotic even if you start to feel better.  If you were prescribed a gel that contains a numbing medicine, use it exactly as told in the directions. Do not use these gels for children who are younger than 2 years of age.  Do not drive or use heavy machinery while taking prescription pain medicine. General instructions  Rinse out your mouth often with salt water to relieve pain or swelling. To make a salt-water mixture, completely dissolve -1 tsp of salt in 1 cup of warm water.  Eat a soft diet while your abscess is healing.  Drink enough fluid to keep your urine pale yellow.  Do not apply heat to the outside of your mouth.  Do not use any products that contain nicotine or   tobacco, such as cigarettes and e-cigarettes. If you need help quitting, ask your health care provider.  Keep all follow-up visits as told by your dentist. This is important.   How is this prevented?  Brush your teeth every morning and night with fluoride toothpaste. Floss one time each day.  Get regularly scheduled dental cleanings.  Consider having a dental sealant applied on teeth that have deep holes (caries).  Drink fluoridated water regularly. This includes most tap water. Check the label  on bottled water to see if it contains fluoride.  Drink water instead of sugary drinks.  Eat healthy meals and snacks.  Wear a mouth guard or face shield to protect your teeth while playing sports. Contact a health care provider if:  Your pain is worse and is not helped by medicine. Get help right away if:  You have a fever or chills.  Your symptoms suddenly get worse.  You have a very bad headache.  You have problems breathing or swallowing.  You have trouble opening your mouth.  You have swelling in your neck or around your eye. Summary  A dental abscess is a collection of pus in or around a tooth that results from an infection.  A dental abscess may result from severe tooth decay, trauma to the tooth, or severe gum disease around a tooth.  Symptoms include severe pain, swelling, redness, and drainage of pus in and around the infected tooth.  The first priority in treating a dental abscess is to drain out the pus. Treatment may also involve removing damage inside the tooth (root canal) or pulling out (extracting) the tooth. This information is not intended to replace advice given to you by your health care provider. Make sure you discuss any questions you have with your health care provider. Document Revised: 05/07/2020 Document Reviewed: 05/07/2020 Elsevier Patient Education  2021 [employer]vier Inc.  

## 2021-05-13 ENCOUNTER — Encounter: Payer: Self-pay | Admitting: Emergency Medicine

## 2021-05-13 ENCOUNTER — Other Ambulatory Visit: Payer: Self-pay

## 2021-05-13 ENCOUNTER — Ambulatory Visit
Admission: EM | Admit: 2021-05-13 | Discharge: 2021-05-13 | Disposition: A | Discharge status: Home / Self Care | Payer: Medicaid Other | Attending: Emergency Medicine | Admitting: Emergency Medicine

## 2021-05-13 DIAGNOSIS — G44319 Acute post-traumatic headache, not intractable: Secondary | ICD-10-CM

## 2021-05-13 DIAGNOSIS — R6884 Jaw pain: Secondary | ICD-10-CM | POA: Diagnosis not present

## 2021-05-13 MED ORDER — MELOXICAM 15 MG PO TABS: 15.0000 mg | ORAL_TABLET | Freq: Every day | ORAL | 0 refills | Status: DC
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
## 2021-05-13 NOTE — Discharge Instructions (Addendum)
Unable to rule out mandible fracture in urgent care setting due to limited x-rays capabilities.  Offered patient further evaluation and management in the ED.  Patient declines at this time and would like to try outpatient therapy first.  Aware of the risk associated with this decision including missed diagnosis, organ damage, organ failure, and/or death.  Patient aware and in agreement.     Apply ice Mobic prescribed.  Take as directed for pain Follow up with PCP if symptoms persists Return or go to the ER if you have any new or worsening symptoms such as fever, chills, nausea, vomiting, chest pain, shortness of breath, cough, vision changes, worsening headache despite treatment, slurred speech, facial asymmetry, weakness in arms or legs, etc..Marland Kitchen
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
## 2021-07-13 ENCOUNTER — Telehealth: Payer: Medicaid Other | Admitting: Emergency Medicine

## 2021-07-13 DIAGNOSIS — K0889 Other specified disorders of teeth and supporting structures: Secondary | ICD-10-CM

## 2021-07-13 DIAGNOSIS — K047 Periapical abscess without sinus: Secondary | ICD-10-CM

## 2021-07-13 MED ORDER — AMOXICILLIN-POT CLAVULANATE 875-125 MG PO TABS: 1.0000 | ORAL_TABLET | Freq: Two times a day (BID) | ORAL | 0 refills | Status: DC

## 2021-07-13 NOTE — Patient Instructions (Signed)
  Daniel Gonzales, thank you for joining Daniel Parsons, NP for today's virtual visit.  While this provider is not your primary care provider (PCP), if your PCP is located in our provider database this encounter information will be shared with them immediately following your visit.  Consent: (Patient) Daniel Gonzales provided verbal consent for this virtual visit at the beginning of the encounter.  Current Medications:  Current Outpatient Medications:    amoxicillin-clavulanate (AUGMENTIN) 875-125 MG tablet, Take 1 tablet by mouth 2 (two) times daily., Disp: 20 tablet, Rfl: 0   Medications ordered in this encounter:  Meds ordered this encounter  Medications   amoxicillin-clavulanate (AUGMENTIN) 875-125 MG tablet    Sig: Take 1 tablet by mouth 2 (two) times daily.    Dispense:  20 tablet    Refill:  0     *If you need refills on other medications prior to your next appointment, please contact your pharmacy*  Follow-Up: Call back or seek an in-person evaluation if the symptoms worsen or if the condition fails to improve as anticipated.  Other Instructions Follow up with a dentist - you will probably keep getting infections until those tooth fragments are removed. [facility].  Finish all of the antibiotics as prescribed even if you start to feel better.  You can use Tylenol and or ibuprofen as directed on those bottles for pain.  If the antibiotics do not heal this dental infection, you will need to seek further care.   If you have been instructed to have an in-person evaluation today at a local Urgent Care facility, please use the link below. It will take you to a list of all of our available [facility], including address, phone number and hours of operation. Please do not delay care.  No Name Urgent Cares  If you or a family member do not have a primary care provider, use the link below to schedule a visit and establish care. When you choose a  [facility] primary care physician or advanced practice provider, you gain a long-term partner in health. Find a Primary Care Provider  Learn more about [facility]'s in-office and virtual care options: [facility] - Get Care Now

## 2021-07-13 NOTE — Progress Notes (Signed)
Virtual Visit Consent   Daniel Gonzales, you are scheduled for a virtual visit with a [facility] provider today.     Just as with appointments in the office, your consent must be obtained to participate.  Your consent will be active for this visit and any virtual visit you may have with one of our providers in the next 365 days.     If you have a MyChart account, a copy of this consent can be sent to you electronically.  All virtual visits are billed to your insurance company just like a traditional visit in the office.    As this is a virtual visit, video technology does not allow for your provider to perform a traditional examination.  This may limit your provider's ability to fully assess your condition.  If your provider identifies any concerns that need to be evaluated in person or the need to arrange testing (such as labs, EKG, etc.), we will make arrangements to do so.     Although advances in technology are sophisticated, we cannot ensure that it will always work on either your end or our end.  If the connection with a video visit is poor, the visit may have to be switched to a telephone visit.  With either a video or telephone visit, we are not always able to ensure that we have a secure connection.     I need to obtain your verbal consent now.   Are you willing to proceed with your visit today?    Daniel Gonzales has provided verbal consent on 07/13/2021 for a virtual visit (video or telephone).   Cathlyn Parsons, NP   Date: 07/13/2021 11:30 AM   Virtual Visit via Video Note   I, Cathlyn Parsons, connected with  Daniel Gonzales  ([id number], 10-10-1986) on 07/13/21 at 11:00 AM EDT by a video-enabled telemedicine application and verified that I am speaking with the correct person using two identifiers.  Location: Patient: Virtual Visit Location Patient: Home Provider: Virtual Visit Location Provider: Home Office   I discussed the limitations of evaluation and management by  telemedicine and the availability of in person appointments. The patient expressed understanding and agreed to proceed.    History of Present Illness: Daniel Gonzales is a 35 y.o. who identifies as a male who was assigned male at birth, and is being seen today for tooth ache.  Patient reports he has 2 broken teeth, one on the right side in the upper jaw, and 1 on the right side in the lower jaw.  These teeth broke off a long time ago.  Patient has not been able to follow-up with a dentist to get the tooth fragments removed.  He has had similar symptoms earlier this summer that were relieved with antibiotics.  He feels he has an infection in the area of those broken teeth again.  Denies fever, denies purulent drainage, denies lump or bump that would indicate a pus pocket.  HPI: HPI  Problems:  Patient Active Problem List   Diagnosis Date Noted   Neck pain 12/08/2015   Smoker 12/08/2015   Cervicalgia 07/14/2011   Pain in joint, ankle and foot 05/19/2011    Allergies: No Known Allergies Medications:  Current Outpatient Medications:    amoxicillin-clavulanate (AUGMENTIN) 875-125 MG tablet, Take 1 tablet by mouth 2 (two) times daily., Disp: 20 tablet, Rfl: 0  Observations/Objective: Patient is well-developed, well-nourished in no acute distress.  Resting comfortably in his car.  Head  is normocephalic, atraumatic.  No labored breathing.  Speech is clear and coherent with logical content.  Patient is alert and oriented at baseline.  His face does not appear to be swollen in the area of his broken teeth/pain.  Assessment and Plan: 1. Toothache  2. Dental infection  Prescribed Augmentin for 10 days for his likely dental infection/dental abscess.  Stressed that this is likely to recur until he gets those tooth fragments removed by a dentist.  Encouraged him to follow-up with a dentist ASAP.  Recommended Tylenol and or ibuprofen for pain.  Follow Up Instructions: I discussed the assessment  and treatment plan with the patient. The patient was provided an opportunity to ask questions and all were answered. The patient agreed with the plan and demonstrated an understanding of the instructions.  A copy of instructions were sent to the patient via MyChart.  The patient was advised to call back or seek an in-person evaluation if the symptoms worsen or if the condition fails to improve as anticipated.  Time:  I spent 10 minutes with the patient via telehealth technology discussing the above problems/concerns.    Cathlyn Parsons, NP

## 2021-11-29 ENCOUNTER — Telehealth: Payer: Medicaid Other | Admitting: Family Medicine

## 2021-11-29 DIAGNOSIS — K0889 Other specified disorders of teeth and supporting structures: Secondary | ICD-10-CM

## 2021-11-29 NOTE — Progress Notes (Signed)
[facility]  Patient took med he had at home and did not get relief. Advised to be seen in person now.

## 2021-11-30 ENCOUNTER — Ambulatory Visit: Payer: Self-pay

## 2022-02-13 DIAGNOSIS — Y92410 Unspecified street and highway as the place of occurrence of the external cause: Secondary | ICD-10-CM | POA: Diagnosis not present

## 2022-02-13 DIAGNOSIS — S8992XA Unspecified injury of left lower leg, initial encounter: Secondary | ICD-10-CM | POA: Insufficient documentation

## 2022-02-13 DIAGNOSIS — S299XXA Unspecified injury of thorax, initial encounter: Secondary | ICD-10-CM | POA: Insufficient documentation

## 2022-02-13 DIAGNOSIS — S199XXA Unspecified injury of neck, initial encounter: Secondary | ICD-10-CM | POA: Insufficient documentation

## 2022-02-14 ENCOUNTER — Emergency Department (HOSPITAL_COMMUNITY): Payer: Medicaid Other

## 2022-02-14 ENCOUNTER — Emergency Department (HOSPITAL_COMMUNITY)
Admission: EM | Admit: 2022-02-14 | Discharge: 2022-02-14 | Disposition: A | Discharge status: Home / Self Care | Payer: Medicaid Other | Attending: Emergency Medicine | Admitting: Emergency Medicine

## 2022-02-14 ENCOUNTER — Other Ambulatory Visit: Payer: Self-pay

## 2022-02-14 ENCOUNTER — Encounter (HOSPITAL_COMMUNITY): Payer: Self-pay | Admitting: Emergency Medicine

## 2022-02-14 DIAGNOSIS — M25562 Pain in left knee: Secondary | ICD-10-CM

## 2022-02-14 DIAGNOSIS — M7918 Myalgia, other site: Secondary | ICD-10-CM

## 2022-02-14 DIAGNOSIS — M542 Cervicalgia: Secondary | ICD-10-CM

## 2022-02-14 MED ORDER — NAPROXEN 500 MG PO TABS: 500.0000 mg | ORAL_TABLET | Freq: Two times a day (BID) | ORAL | 0 refills | Status: DC

## 2022-02-14 MED ORDER — CYCLOBENZAPRINE HCL 5 MG PO TABS: 5.0000 mg | ORAL_TABLET | Freq: Two times a day (BID) | ORAL | 0 refills | Status: DC | PRN

## 2022-02-14 MED ORDER — IBUPROFEN 400 MG PO TABS
400.0000 mg | ORAL_TABLET | Freq: Once | ORAL | Status: AC
  Administered 2022-02-14: 400 mg via ORAL
  Filled 2022-02-14: qty 1
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
## 2022-02-14 NOTE — ED Notes (Signed)
C collar placed on pt in triage.

## 2022-09-28 ENCOUNTER — Ambulatory Visit (INDEPENDENT_AMBULATORY_CARE_PROVIDER_SITE_OTHER): Payer: Medicaid Other

## 2022-09-28 ENCOUNTER — Ambulatory Visit
Admission: EM | Admit: 2022-09-28 | Discharge: 2022-09-28 | Disposition: A | Discharge status: Home / Self Care | Payer: Medicaid Other

## 2022-09-28 DIAGNOSIS — S62307A Unspecified fracture of fifth metacarpal bone, left hand, initial encounter for closed fracture: Secondary | ICD-10-CM | POA: Diagnosis not present

## 2022-09-28 DIAGNOSIS — W2209XA Striking against other stationary object, initial encounter: Secondary | ICD-10-CM | POA: Diagnosis not present

## 2022-09-28 DIAGNOSIS — M79642 Pain in left hand: Secondary | ICD-10-CM | POA: Diagnosis not present

## 2022-09-28 MED ORDER — IBUPROFEN 800 MG PO TABS: 800.0000 mg | ORAL_TABLET | Freq: Three times a day (TID) | ORAL | 0 refills | Status: DC | PRN

## 2022-09-28 NOTE — ED Provider Notes (Signed)
[facility]    CSN: [id number] Arrival date & time: 09/28/22  1645      History   Chief Complaint Chief Complaint  Patient presents with   Hand Problem    HPI EMMANUELL Gonzales is a 36 y.o. male.   The history is provided by the patient.   Patient presents for complaints of left hand pain that started after he hit a wall last evening.  He states he has pain in the knuckle joint of the left pinky finger.  He denies numbness, tingling, or radiation of pain.  Patient states that he has not had any previous injury to the left hand.  He states that he did ice the left hand last evening.  Reports he has been taking ibuprofen for his pain.  Past Medical History:  Diagnosis Date   Back pain    Opiate addiction [facility])     Patient Active Problem List   Diagnosis Date Noted   Neck pain 12/08/2015   Smoker 12/08/2015   Cervicalgia 07/14/2011   Pain in joint, ankle and foot 05/19/2011    History reviewed. No pertinent surgical history.     Home Medications    Prior to Admission medications   Medication Sig Start Date End Date Taking? Authorizing Provider  baclofen (LIORESAL) 10 MG tablet Take 10 mg by mouth 3 (three) times daily as needed. 07/28/22  Yes [provider]  ibuprofen (ADVIL) 800 MG tablet Take 1 tablet (800 mg total) by mouth every 8 (eight) hours as needed. 09/28/22  Yes Castin Donaghue-Warren, Sadie Haber, NP  Multiple Vitamin (MULTIVITAMIN ADULT PO) Take by mouth.   Yes [provider]  amoxicillin-clavulanate (AUGMENTIN) 875-125 MG tablet Take 1 tablet by mouth 2 (two) times daily. 07/13/21   Cathlyn Parsons, NP  cyclobenzaprine (FLEXERIL) 5 MG tablet Take 1 tablet (5 mg total) by mouth 2 (two) times daily as needed for muscle spasms. 02/14/22   Horton, Mayer Masker, MD  naproxen (NAPROSYN) 500 MG tablet Take 1 tablet (500 mg total) by mouth 2 (two) times daily. 02/14/22   Horton, Mayer Masker, MD    Family History Family History  Problem Relation  Age of Onset   Healthy Mother    Healthy Father     Social History Social History   Tobacco Use   Smoking status: Every Day    Packs/day: 1.00    Types: Cigarettes   Smokeless tobacco: Never  Substance Use Topics   Alcohol use: Yes    Comment: 12 pack a week   Drug use: Not Currently     Allergies   Patient has no known allergies.   Review of Systems Review of Systems Per HPI Physical Exam Triage Vital Signs ED Triage Vitals  Enc Vitals Group     BP 09/28/22 1651 (!) 149/94     Pulse Rate 09/28/22 1651 86     Resp 09/28/22 1651 16     Temp 09/28/22 1651 97.9 F (36.6 C)     Temp Source 09/28/22 1651 Oral     SpO2 09/28/22 1651 97 %     Weight --      Height --      Head Circumference --      Peak Flow --      Pain Score 09/28/22 1653 10     Pain Loc --      Pain Edu? --      Excl. in GC? --    No data found.  Updated Vital Signs BP (!) 149/94 (BP Location: Right Arm)   Pulse 86   Temp 97.9 F (36.6 C) (Oral)   Resp 16   SpO2 97%   Visual Acuity Right Eye Distance:   Left Eye Distance:   Bilateral Distance:    Right Eye Near:   Left Eye Near:    Bilateral Near:     Physical Exam Vitals and nursing note reviewed.  HENT:     Head: Normocephalic.  Pulmonary:     Effort: Pulmonary effort is normal.  Musculoskeletal:     Left hand: Swelling and tenderness (Fifth metacarpal) present. Decreased range of motion. Decreased strength. Normal sensation. Normal capillary refill. Normal pulse.  Skin:    General: Skin is warm and dry.  Neurological:     Mental Status: He is oriented to person, place, and time.  Psychiatric:        Mood and Affect: Mood normal.        Behavior: Behavior normal.      UC Treatments / Results  Labs (all labs ordered are listed, but only abnormal results are displayed) Labs Reviewed - No data to display  EKG   Radiology DG Hand Complete Left  Result Date: 09/28/2022 CLINICAL DATA:  Punched a wall last night,  pain and swelling EXAM: LEFT HAND - COMPLETE 3+ VIEW COMPARISON:  None Available. FINDINGS: Frontal, oblique, and lateral views of the left hand are obtained. There is a comminuted extra-articular fracture of the distal aspect of the fifth metacarpal, with ventral angulation and impaction at the fracture site. The metacarpophalangeal joint remains in anatomic alignment. Overlying soft tissue swelling throughout the ulnar aspect of the hand. No other acute bony abnormalities.  Joint spaces are well preserved. IMPRESSION: 1. Comminuted impacted extra-articular fracture distal margin left fifth metacarpal. Electronically Signed   By: Sharlet Salina M.D.   On: 09/28/2022 17:10    Procedures Procedures (including critical care time)  Medications Ordered in UC Medications - No data to display  Initial Impression / Assessment and Plan / UC Course  I have reviewed the triage vital signs and the nursing notes.  Pertinent labs & imaging results that were available during my care of the patient were reviewed by me and considered in my medical decision making (see chart for details).  Patient presents for complaints of left hand pain and swelling after he hit a wall 1 day ago.  On exam, patient is well-appearing, he is in no acute distress, vital signs are stable.  X-rays are positive for a fracture of the distal margin of the left fifth metacarpal.  An ulnar gutter splint was applied for stabilization and immobilization.  RICE therapy was suggested for the patient.  Patient was prescribed ibuprofen 800 mg for his pain.  Patient was advised to follow-up with orthopedics within the next 24 to 48 hours.  Patient was given information for [facility] and for [facility].  Patient verbalizes understanding.  All questions were answered.  Patient is stable for discharge. Final Clinical Impressions(s) / UC Diagnoses   Final diagnoses:  Closed displaced fracture of fifth metacarpal bone of left hand,  unspecified portion of metacarpal, initial encounter     Discharge Instructions      X-ray shows that you have a fracture to the left fifth digit. Keep the splint in place until you have been seen by orthopedics. RICE therapy, rest, ice, compression and elevation. Take medication as prescribed. If your symptoms worsen, recommend taking Tylenol extra  strength 500 mg or Tylenol arthritis strength 650 mg tablet with the ibuprofen.  Do not exceed more than 800 mg with each dose of the ibuprofen. Please follow-up with orthopedics within the next 24 to 48 hours.  You can follow-up with [facility] at [phone] or with [facility] at [phone]. Follow-up as needed.      ED Prescriptions     Medication Sig Dispense Auth. Provider   ibuprofen (ADVIL) 800 MG tablet Take 1 tablet (800 mg total) by mouth every 8 (eight) hours as needed. 30 tablet Adelyna Brockman-Warren, Sadie Haber, NP      PDMP not reviewed this encounter.   Abran Cantor, NP 09/28/22 1814

## 2022-09-28 NOTE — ED Triage Notes (Signed)
Pt reports pain and swelling left hand x 1 day after he hit a wall. Ibuprofen gives no relief.

## 2022-09-28 NOTE — Discharge Instructions (Addendum)
X-ray shows that you have a fracture to the left fifth digit. Keep the splint in place until you have been seen by orthopedics. RICE therapy, rest, ice, compression and elevation. Take medication as prescribed. If your symptoms worsen, recommend taking Tylenol extra strength 500 mg or Tylenol arthritis strength 650 mg tablet with the ibuprofen.  Do not exceed more than 800 mg with each dose of the ibuprofen. Please follow-up with orthopedics within the next 24 to 48 hours.  You can follow-up with [facility] at [phone] or with [facility] at [phone]. Follow-up as needed.

## 2022-10-04 ENCOUNTER — Ambulatory Visit: Payer: Medicaid Other | Admitting: Orthopaedic Surgery

## 2022-10-10 ENCOUNTER — Ambulatory Visit: Payer: Medicaid Other | Admitting: Orthopaedic Surgery

## 2022-12-06 ENCOUNTER — Other Ambulatory Visit: Payer: Self-pay

## 2022-12-06 ENCOUNTER — Encounter: Payer: Self-pay | Admitting: Emergency Medicine

## 2022-12-06 ENCOUNTER — Ambulatory Visit
Admission: EM | Admit: 2022-12-06 | Discharge: 2022-12-06 | Disposition: A | Discharge status: Home / Self Care | Payer: Medicaid Other

## 2022-12-06 DIAGNOSIS — L03113 Cellulitis of right upper limb: Secondary | ICD-10-CM

## 2022-12-06 MED ORDER — AMOXICILLIN-POT CLAVULANATE 875-125 MG PO TABS: 1.0000 | ORAL_TABLET | Freq: Two times a day (BID) | ORAL | 0 refills | Status: DC

## 2022-12-06 NOTE — Discharge Instructions (Addendum)
Take medication as prescribed. May apply ice to the right hand to help with pain or swelling.  Apply for 20 minutes, remove for 1 hour, then repeat as much as possible. May take over-the-counter ibuprofen or Tylenol as needed for pain, fever, or general discomfort. Follow-up immediately if you develop swelling, increased redness that goes into the hand or up the arm, drainage, or if you develop fever, chills, or other concerns. Follow-up as needed.
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
## 2022-12-06 NOTE — ED Provider Notes (Signed)
[facility]    CSN: [id number] Arrival date & time: 12/06/22  1900      History   Chief Complaint Chief Complaint  Patient presents with   Wound Check    HPI Daniel Gonzales is a 37 y.o. male.   The history is provided by the patient.   Patient presents for complaints of pain and swelling to the right hand.  Patient states approximately 2 weeks ago, he cut the right hand underneath his pinky with a piece of glass.  He states that eventually he used superglue to keep the wound "together".  He states today, he noticed tenderness, swelling, and redness to the right hand at the site.  He denies fever, chills, chest pain, abdominal pain, nausea, vomiting, or diarrhea.  The patient reports his last tetanus shot was approximately 1 year ago.  Past Medical History:  Diagnosis Date   Back pain    Opiate addiction [facility])     Patient Active Problem List   Diagnosis Date Noted   Neck pain 12/08/2015   Smoker 12/08/2015   Cervicalgia 07/14/2011   Pain in joint, ankle and foot 05/19/2011    History reviewed. No pertinent surgical history.     Home Medications    Prior to Admission medications   Medication Sig Start Date End Date Taking? Authorizing Provider  amoxicillin-clavulanate (AUGMENTIN) 875-125 MG tablet Take 1 tablet by mouth every 12 (twelve) hours. 12/06/22  Yes Kiley Solimine-Warren, Alda Lea, NP  buprenorphine-naloxone (SUBOXONE) 8-2 mg SUBL SL tablet Place 1 tablet under the tongue in the morning and at bedtime.   Yes [provider]  baclofen (LIORESAL) 10 MG tablet Take 10 mg by mouth 3 (three) times daily as needed. Patient not taking: Reported on 12/06/2022 07/28/22   [provider]  cyclobenzaprine (FLEXERIL) 5 MG tablet Take 1 tablet (5 mg total) by mouth 2 (two) times daily as needed for muscle spasms. 02/14/22   Horton, Barbette Hair, MD  ibuprofen (ADVIL) 800 MG tablet Take 1 tablet (800 mg total) by mouth every 8 (eight) hours as needed.  09/28/22   Olly Shiner-Warren, Alda Lea, NP  Multiple Vitamin (MULTIVITAMIN ADULT PO) Take by mouth.    [provider]  naproxen (NAPROSYN) 500 MG tablet Take 1 tablet (500 mg total) by mouth 2 (two) times daily. 02/14/22   Horton, Barbette Hair, MD    Family History Family History  Problem Relation Age of Onset   Healthy Mother    Healthy Father     Social History Social History   Tobacco Use   Smoking status: Every Day    Packs/day: 1.00    Types: Cigarettes   Smokeless tobacco: Never  Substance Use Topics   Alcohol use: Yes    Comment: 12 pack a week   Drug use: Not Currently     Allergies   Patient has no known allergies.   Review of Systems Review of Systems Per HPI  Physical Exam Triage Vital Signs ED Triage Vitals  Enc Vitals Group     BP 12/06/22 1929 (!) 144/96     Pulse Rate 12/06/22 1929 84     Resp 12/06/22 1929 18     Temp 12/06/22 1929 98.2 F (36.8 C)     Temp Source 12/06/22 1929 Oral     SpO2 12/06/22 1929 98 %     Weight --      Height --      Head Circumference --  Peak Flow --      Pain Score 12/06/22 1931 0     Pain Loc --      Pain Edu? --      Excl. in [place]? --    No data found.  Updated Vital Signs BP (!) 144/96 (BP Location: Right Arm)   Pulse 84   Temp 98.2 F (36.8 C) (Oral)   Resp 18   SpO2 98%   Visual Acuity Right Eye Distance:   Left Eye Distance:   Bilateral Distance:    Right Eye Near:   Left Eye Near:    Bilateral Near:     Physical Exam Vitals and nursing note reviewed.  Constitutional:      General: He is not in acute distress.    Appearance: Normal appearance.  HENT:     Head: Normocephalic.  Eyes:     Extraocular Movements: Extraocular movements intact.     Pupils: Pupils are equal, round, and reactive to light.  Musculoskeletal:     Cervical back: Normal range of motion.  Lymphadenopathy:     Cervical: No cervical adenopathy.  Skin:    General: Skin is warm and dry.     Findings:  Laceration present.     Comments: Approximate 1.5 cm laceration to the right hand at the fifth metacarpal joint.  Laceration surrounded by swelling and erythematous base.  Area is tender to palpation.  There is no fluctuance, oozing, or drainage present.  Neurological:     General: No focal deficit present.     Mental Status: He is alert and oriented to person, place, and time.  Psychiatric:        Mood and Affect: Mood normal.        Behavior: Behavior normal.      UC Treatments / Results  Labs (all labs ordered are listed, but only abnormal results are displayed) Labs Reviewed - No data to display  EKG   Radiology No results found.  Procedures Procedures (including critical care time)  Medications Ordered in UC Medications - No data to display  Initial Impression / Assessment and Plan / UC Course  I have reviewed the triage vital signs and the nursing notes.  Pertinent labs & imaging results that were available during my care of the patient were reviewed by me and considered in my medical decision making (see chart for details).  The patient is well-appearing, he is in no acute distress, vital signs are stable.  Patient with healing laceration to the right hand at the fifth metacarpal.  Suspect cellulitis at the joint due to tenderness, erythema, and swelling noted.  Will start patient on Augmentin 875/125 mg for the next 7 days.  Supportive care recommendations were provided to the patient along with strict return precautions.  Patient verbalizes understanding.  All questions were answered.  Patient is stable for discharge.   Final Clinical Impressions(s) / UC Diagnoses   Final diagnoses:  Cellulitis of hand, right     Discharge Instructions      Take medication as prescribed. May apply ice to the right hand to help with pain or swelling.  Apply for 20 minutes, remove for 1 hour, then repeat as much as possible. May take over-the-counter ibuprofen or Tylenol as  needed for pain, fever, or general discomfort. Follow-up immediately if you develop swelling, increased redness that goes into the hand or up the arm, drainage, or if you develop fever, chills, or other concerns. Follow-up as needed.  ED Prescriptions     Medication Sig Dispense Auth. Provider   amoxicillin-clavulanate (AUGMENTIN) 875-125 MG tablet Take 1 tablet by mouth every 12 (twelve) hours. 14 tablet Lygia Olaes-Warren, Alda Lea, NP      PDMP not reviewed this encounter.   Tish Men, NP 12/06/22 1953

## 2022-12-28 ENCOUNTER — Encounter: Payer: Self-pay | Admitting: Radiology

## 2023-07-27 ENCOUNTER — Ambulatory Visit
Admission: EM | Admit: 2023-07-27 | Discharge: 2023-07-27 | Disposition: A | Discharge status: Home / Self Care | Payer: Medicaid Other | Attending: Family Medicine | Admitting: Family Medicine

## 2023-07-27 DIAGNOSIS — S61412A Laceration without foreign body of left hand, initial encounter: Secondary | ICD-10-CM | POA: Diagnosis not present

## 2023-07-27 MED ORDER — CHLORHEXIDINE GLUCONATE 4 % EX SOLN: Freq: Every day | CUTANEOUS | 0 refills | Status: DC | PRN

## 2023-07-27 MED ORDER — MUPIROCIN 2 % EX OINT: 1.0000 | TOPICAL_OINTMENT | Freq: Every day | CUTANEOUS | 0 refills | Status: DC

## 2023-07-27 MED ORDER — LIDOCAINE-EPINEPHRINE-TETRACAINE (LET) TOPICAL GEL
3.0000 mL | Freq: Once | TOPICAL | Status: AC
  Administered 2023-07-27: 3 mL via TOPICAL

## 2023-07-27 MED ORDER — CEPHALEXIN 500 MG PO CAPS: 500.0000 mg | ORAL_CAPSULE | Freq: Two times a day (BID) | ORAL | 0 refills | Status: DC

## 2023-07-27 NOTE — ED Triage Notes (Signed)
Pt reports he cut his left hand on a box cutter earlier today    Last tetanus 5 years

## 2023-07-27 NOTE — Discharge Instructions (Signed)
Clean the area at least once a day with the Hibiclens solution and apply mupirocin ointment and a nonstick dressing.  Keep it dressed at all times until it is fully healed, even once the sutures come out.  Come back in about 10 days for suture removal.  If the area becomes red, swollen, has thick yellow drainage or any other worsening symptoms return sooner for a recheck.  Take the full course of antibiotics sent.

## 2023-07-29 NOTE — ED Provider Notes (Signed)
[facility]    CSN: [id number] Arrival date & time: 07/27/23  2001      History   Chief Complaint No chief complaint on file.   HPI Daniel Gonzales is a 37 y.o. male.   Patient presenting today with a laceration to left hand that occurred just prior to arrival from a box cutter. Has kept pressure on the area but otherwise not yet tried anything for this as it just happened. Denies uncontrolled bleeding, numbness, loss of ROM, concern for foreign body. Last tdap 5 years ago. Does not want to update today.     Past Medical History:  Diagnosis Date   Back pain    Opiate addiction [facility])     Patient Active Problem List   Diagnosis Date Noted   Neck pain 12/08/2015   Smoker 12/08/2015   Cervicalgia 07/14/2011   Pain in joint, ankle and foot 05/19/2011    History reviewed. No pertinent surgical history.     Home Medications    Prior to Admission medications   Medication Sig Start Date End Date Taking? Authorizing Provider  cephALEXin (KEFLEX) 500 MG capsule Take 1 capsule (500 mg total) by mouth 2 (two) times daily. 07/27/23  Yes Particia Nearing, PA-C  chlorhexidine (HIBICLENS) 4 % external liquid Apply topically daily as needed. 07/27/23  Yes Particia Nearing, PA-C  mupirocin ointment (BACTROBAN) 2 % Apply 1 Application topically daily. 07/27/23  Yes Particia Nearing, PA-C  amoxicillin-clavulanate (AUGMENTIN) 875-125 MG tablet Take 1 tablet by mouth every 12 (twelve) hours. 12/06/22   Leath-Warren, Sadie Haber, NP  baclofen (LIORESAL) 10 MG tablet Take 10 mg by mouth 3 (three) times daily as needed. Patient not taking: Reported on 12/06/2022 07/28/22   [provider]  buprenorphine-naloxone (SUBOXONE) 8-2 mg SUBL SL tablet Place 1 tablet under the tongue in the morning and at bedtime.    [provider]  cyclobenzaprine (FLEXERIL) 5 MG tablet Take 1 tablet (5 mg total) by mouth 2 (two) times daily as needed for muscle spasms.  02/14/22   Horton, Mayer Masker, MD  ibuprofen (ADVIL) 800 MG tablet Take 1 tablet (800 mg total) by mouth every 8 (eight) hours as needed. 09/28/22   Leath-Warren, Sadie Haber, NP  Multiple Vitamin (MULTIVITAMIN ADULT PO) Take by mouth.    [provider]  naproxen (NAPROSYN) 500 MG tablet Take 1 tablet (500 mg total) by mouth 2 (two) times daily. 02/14/22   Horton, Mayer Masker, MD    Family History Family History  Problem Relation Age of Onset   Healthy Mother    Healthy Father     Social History Social History   Tobacco Use   Smoking status: Every Day    Current packs/day: 1.00    Types: Cigarettes   Smokeless tobacco: Never  Substance Use Topics   Alcohol use: Yes    Comment: 12 pack a week   Drug use: Not Currently     Allergies   Patient has no known allergies.   Review of Systems Review of Systems PER HPI  Physical Exam Triage Vital Signs ED Triage Vitals  Encounter Vitals Group     BP 07/27/23 2004 (!) 140/102     Systolic BP Percentile --      Diastolic BP Percentile --      Pulse Rate 07/27/23 2004 (!) 102     Resp 07/27/23 2004 20     Temp 07/27/23 2007 97.8 F (36.6 C)  Temp Source 07/27/23 2004 Oral     SpO2 07/27/23 2004 95 %     Weight --      Height --      Head Circumference --      Peak Flow --      Pain Score 07/27/23 2004 0     Pain Loc --      Pain Education --      Exclude from Growth Chart --    No data found.  Updated Vital Signs BP (!) 140/102 (BP Location: Right Arm)   Pulse (!) 102   Temp 97.8 F (36.6 C) (Oral)   Resp 20   SpO2 95%   Visual Acuity Right Eye Distance:   Left Eye Distance:   Bilateral Distance:    Right Eye Near:   Left Eye Near:    Bilateral Near:     Physical Exam Vitals and nursing note reviewed.  Constitutional:      Appearance: Normal appearance.  HENT:     Head: Atraumatic.  Eyes:     Extraocular Movements: Extraocular movements intact.     Conjunctiva/sclera: Conjunctivae  normal.  Cardiovascular:     Rate and Rhythm: Normal rate and regular rhythm.  Pulmonary:     Effort: Pulmonary effort is normal.     Breath sounds: Normal breath sounds.  Musculoskeletal:        General: Tenderness and signs of injury present. No swelling or deformity. Normal range of motion.     Cervical back: Normal range of motion and neck supple.  Skin:    General: Skin is warm.     Comments: 1.5 cm slightly gaping laceration to dorsal left hand near base of thumb  Neurological:     General: No focal deficit present.     Mental Status: He is oriented to person, place, and time.     Comments: Left hand neurovascularly intact  Psychiatric:        Mood and Affect: Mood normal.        Thought Content: Thought content normal.        Judgment: Judgment normal.      UC Treatments / Results  Labs (all labs ordered are listed, but only abnormal results are displayed) Labs Reviewed - No data to display  EKG   Radiology No results found.  Procedures Laceration Repair  Date/Time: 07/27/2023 9:11 PM  Performed by: Particia Nearing, PA-C Authorized by: Particia Nearing, PA-C   Consent:    Consent obtained:  Verbal   Risks, benefits, and alternatives were discussed: yes     Risks discussed:  Infection and need for additional repair   Alternatives discussed:  Observation Universal protocol:    Procedure explained and questions answered to patient or proxy's satisfaction: yes     Relevant documents present and verified: yes     Patient identity confirmed:  Verbally with patient and arm band Anesthesia:    Anesthesia method:  Topical application and local infiltration   Topical anesthetic:  LET   Local anesthetic:  Lidocaine 1% w/o epi Laceration details:    Location:  Hand   Hand location:  L hand, dorsum   Length (cm):  1.5   Depth (mm):  5 Pre-procedure details:    Preparation:  Patient was prepped and draped in usual sterile fashion Exploration:     Limited defect created (wound extended): no     Hemostasis achieved with:  LET   Wound exploration: wound explored through full  range of motion     Contaminated: no   Treatment:    Area cleansed with:  Chlorhexidine   Amount of cleaning:  Standard   Irrigation solution:  Sterile water   Irrigation method:  Pressure wash   Visualized foreign bodies/material removed: no     Debridement:  None   Undermining:  None   Scar revision: no   Skin repair:    Repair method:  Sutures   Suture size:  4-0   Suture material:  Prolene   Suture technique:  Simple interrupted   Number of sutures:  3 Approximation:    Approximation:  Close Repair type:    Repair type:  Simple Post-procedure details:    Dressing:  Non-adherent dressing   Procedure completion:  Tolerated well, no immediate complications  (including critical care time)  Medications Ordered in UC Medications  lidocaine-EPINEPHrine-tetracaine (LET) topical gel (3 mLs Topical Given 07/27/23 2008)    Initial Impression / Assessment and Plan / UC Course  I have reviewed the triage vital signs and the nursing notes.  Pertinent labs & imaging results that were available during my care of the patient were reviewed by me and considered in my medical decision making (see chart for details).     LET gel applied to help with pain and hemostasis. Wound cleaned thoroughly with hibiclens and sutures placed without immediate complication. Good home wound care reviewed, hibiclens, mupirocin and abx sent. Return precautions for worsening sxs reviewed and suture removal in about 10 days.   Final Clinical Impressions(s) / UC Diagnoses   Final diagnoses:  Laceration of left hand without foreign body, initial encounter     Discharge Instructions      Clean the area at least once a day with the Hibiclens solution and apply mupirocin ointment and a nonstick dressing.  Keep it dressed at all times until it is fully healed, even once the sutures  come out.  Come back in about 10 days for suture removal.  If the area becomes red, swollen, has thick yellow drainage or any other worsening symptoms return sooner for a recheck.  Take the full course of antibiotics sent.    ED Prescriptions     Medication Sig Dispense Auth. Provider   cephALEXin (KEFLEX) 500 MG capsule Take 1 capsule (500 mg total) by mouth 2 (two) times daily. 14 capsule Particia Nearing, PA-C   mupirocin ointment (BACTROBAN) 2 % Apply 1 Application topically daily. 22 g Particia Nearing, [place]   chlorhexidine (HIBICLENS) 4 % external liquid Apply topically daily as needed. 236 mL Particia Nearing, [place]      PDMP not reviewed this encounter.   Particia Nearing, [place] 07/29/23 2313
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
## 2024-06-20 ENCOUNTER — Encounter: Payer: Self-pay | Admitting: Radiology

## 2024-07-18 ENCOUNTER — Emergency Department (HOSPITAL_COMMUNITY)
Admission: EM | Admit: 2024-07-18 | Discharge: 2024-07-18 | Disposition: A | Discharge status: Other Acute Inpatient Hospital | Attending: Emergency Medicine | Admitting: Emergency Medicine

## 2024-07-18 ENCOUNTER — Other Ambulatory Visit: Payer: Self-pay

## 2024-07-18 ENCOUNTER — Encounter (HOSPITAL_COMMUNITY): Payer: Self-pay

## 2024-07-18 DIAGNOSIS — R45850 Homicidal ideations: Secondary | ICD-10-CM

## 2024-07-18 DIAGNOSIS — R4182 Altered mental status, unspecified: Secondary | ICD-10-CM | POA: Diagnosis present

## 2024-07-18 DIAGNOSIS — F23 Brief psychotic disorder: Secondary | ICD-10-CM | POA: Insufficient documentation

## 2024-07-18 DIAGNOSIS — L03113 Cellulitis of right upper limb: Secondary | ICD-10-CM | POA: Diagnosis not present

## 2024-07-18 HISTORY — DX: Essential (primary) hypertension: I10

## 2024-07-18 LAB — COMPREHENSIVE METABOLIC PANEL WITH GFR
ALT: 19 U/L (ref 0–44)
AST: 21 U/L (ref 15–41)
Albumin: 3.7 g/dL (ref 3.5–5.0)
Alkaline Phosphatase: 58 U/L (ref 38–126)
Anion gap: 11 (ref 5–15)
BUN: 14 mg/dL (ref 6–20)
CO2: 26 mmol/L (ref 22–32)
Calcium: 8.9 mg/dL (ref 8.9–10.3)
Chloride: 100 mmol/L (ref 98–111)
Creatinine, Ser: 0.81 mg/dL (ref 0.61–1.24)
GFR, Estimated: >60 mL/min (ref >60)
Glucose, Bld: 100 mg/dL — ABNORMAL HIGH (ref 70–99)
Potassium: 3.6 mmol/L (ref 3.5–5.1)
Sodium: 137 mmol/L (ref 135–145)
Total Bilirubin: 0.5 mg/dL (ref 0.0–1.2)
Total Protein: 6.9 g/dL (ref 6.5–8.1)

## 2024-07-18 LAB — RAPID URINE DRUG SCREEN, HOSP PERFORMED
Amphetamines: POSITIVE — AB
Barbiturates: NOT DETECTED
Benzodiazepines: NOT DETECTED
Cocaine: NOT DETECTED
Opiates: NOT DETECTED
Tetrahydrocannabinol: NOT DETECTED

## 2024-07-18 LAB — CBC
HCT: 44.4 % (ref 39.0–52.0)
Hemoglobin: 14.7 g/dL (ref 13.0–17.0)
MCH: 29.7 pg (ref 26.0–34.0)
MCHC: 33.1 g/dL (ref 30.0–36.0)
MCV: 89.7 fL (ref 80.0–100.0)
Platelets: 280 K/uL (ref 150–400)
RBC: 4.95 MIL/uL (ref 4.22–5.81)
RDW: 13.6 % (ref 11.5–15.5)
WBC: 12.9 K/uL — ABNORMAL HIGH (ref 4.0–10.5)
nRBC: 0 % (ref 0.0–0.2)

## 2024-07-18 LAB — ETHANOL: Alcohol, Ethyl (B): <15 mg/dL (ref <15)

## 2024-07-18 MED ORDER — DOXYCYCLINE HYCLATE 100 MG PO TABS
100.0000 mg | ORAL_TABLET | Freq: Two times a day (BID) | ORAL | Status: DC
  Administered 2024-07-18: 100 mg via ORAL
  Filled 2024-07-18: qty 1
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
## 2024-07-18 NOTE — ED Notes (Signed)
 Pt reported he needed to vomit when staff tried to take him to get TTS.

## 2024-07-18 NOTE — ED Notes (Signed)
Pt TTS at this time 

## 2024-07-18 NOTE — ED Notes (Signed)
 Pt received lunch tray

## 2024-07-18 NOTE — ED Notes (Signed)
[facility] paged for report

## 2024-07-18 NOTE — Progress Notes (Signed)
 Pt has been accepted to [facility] on 07/18/2024 . Bed assignment: Main campus  Pt meets inpatient criteria per Wyline Pizza, NP   Attending Physician will be Millie Manners, MD  Report can be called to: [phone] (this is a pager, please leave call-back number when giving report)  Pt can arrive after ASAP  Care Team Notified:   Wyline Pizza, NP, Rock Law, RN
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
## 2024-07-18 NOTE — Discharge Instructions (Signed)
 Transferred to [facility].  Dr.Madaram accepting

## 2024-07-18 NOTE — ED Triage Notes (Signed)
 Pt verbalized he takes suboxone at times and took Meth 2 days ago.
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
## 2024-07-18 NOTE — ED Notes (Signed)
 The patient looked over at me and asked if I had any meth. I told him no and redirected the patient.

## 2024-07-18 NOTE — ED Notes (Signed)
 Pt can go to [facility].
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
## 2024-07-18 NOTE — ED Notes (Signed)
 Nurse called Silvano Abu per pt request. Nurse informed her of update and she updated nurse about pt behavior. Silvano states he behaves bizarre and she is scared of him when he does drugs. She states he is really sweet when he doesn't do drugs. Silvano states she is scared he would harm her or someone else d/t to anger when doing drugs.

## 2024-07-18 NOTE — Progress Notes (Signed)
 Inpatient Psychiatric Referral  Patient was recommended inpatient per Wyline Pizza, NP. There are no available beds at [facility], per [facility] . Patient was referred to the following out of network facilities:  Destination  Service Provider Address Phone Fax  [facility]  [address] [place] [address] [phone]  [facility]  [address] [place], [place] [address] [phone]  [facility]  [address] [place] [address] [phone]  [facility]  [address], [place] [address] [phone]  [facility]  [address] [place] [address] [phone]  [facility] EFAX  [address] [place], [place] [phone]  [facility]  [address], [place] [address] [phone]  [facility]  [address] [place] [address] [phone]    Situation ongoing, CSW to continue following and update chart as more information becomes available.   Harrie Sofia MSW, LCSWA 07/18/2024  3:07 PM

## 2024-07-18 NOTE — ED Notes (Signed)
 Pt told me he needed to throw up. I went in the bathroom and he had his hand down his throat trying to make himself throw up. I told him to stop and then he said he needed to pee. He started taking a while so I opened the door again and he had his fingers in his pee. I told him to stop and wash his hands.

## 2024-07-18 NOTE — ED Notes (Addendum)
 Pt refused to take po meds until he could actually look up the medication. Pt stated he does not take any medication that he doesn't know for sure what it is. Nurse explained it was a antibiotic for his arm but he wanted to see it for himself. Nurse provided information for pt to read. Pt agreed to take meds. Nurse cleaned area and wrapped it with a dressing. Pt was picking at area and making it bleed.

## 2024-09-01 ENCOUNTER — Encounter: Payer: Self-pay | Admitting: Radiology
